# Patient Record
Sex: Male | Born: 1975 | Race: Black or African American | Hispanic: No | Marital: Single | Smoking: Current every day smoker
Health system: Southern US, Community
[De-identification: ages and names within clinical notes are randomized; demographics above are authoritative.]

## PROBLEM LIST (undated history)

## (undated) DIAGNOSIS — I10 Essential (primary) hypertension: Secondary | ICD-10-CM

## (undated) DIAGNOSIS — E119 Type 2 diabetes mellitus without complications: Secondary | ICD-10-CM

## (undated) HISTORY — PX: APPENDECTOMY: SHX54

---

## 2006-09-19 ENCOUNTER — Emergency Department: Payer: Self-pay

## 2007-03-04 ENCOUNTER — Emergency Department: Payer: Self-pay | Admitting: Emergency Medicine

## 2010-12-24 ENCOUNTER — Emergency Department: Payer: Self-pay | Admitting: Emergency Medicine

## 2011-01-12 ENCOUNTER — Ambulatory Visit: Payer: Self-pay | Admitting: Emergency Medicine

## 2011-01-17 ENCOUNTER — Ambulatory Visit: Payer: Self-pay | Admitting: Emergency Medicine

## 2011-01-18 LAB — PATHOLOGY REPORT

## 2011-09-18 ENCOUNTER — Emergency Department: Payer: Self-pay | Admitting: Emergency Medicine

## 2012-10-30 ENCOUNTER — Emergency Department: Payer: Self-pay | Admitting: Internal Medicine

## 2012-10-30 LAB — BASIC METABOLIC PANEL
Chloride: 106 mmol/L (ref 98–107)
Co2: 26 mmol/L (ref 21–32)
Creatinine: 0.92 mg/dL (ref 0.60–1.30)
EGFR (African American): >60
EGFR (Non-African Amer.): >60
Osmolality: 275 (ref 275–301)
Potassium: 4.1 mmol/L (ref 3.5–5.1)
Sodium: 138 mmol/L (ref 136–145)

## 2012-10-30 LAB — CBC WITH DIFFERENTIAL/PLATELET
Eosinophil #: 0.2 10*3/uL (ref 0.0–0.7)
Eosinophil %: 2 %
HGB: 13.6 g/dL (ref 13.0–18.0)
MCHC: 33.9 g/dL (ref 32.0–36.0)
Monocyte #: 0.6 x10 3/mm (ref 0.2–1.0)
Monocyte %: 6.9 %
Neutrophil %: 55.1 %
Platelet: 201 10*3/uL (ref 150–440)
RDW: 14.7 % — ABNORMAL HIGH (ref 11.5–14.5)
WBC: 8 10*3/uL (ref 3.8–10.6)

## 2014-02-06 ENCOUNTER — Emergency Department: Payer: Self-pay | Admitting: Emergency Medicine

## 2014-02-06 LAB — COMPREHENSIVE METABOLIC PANEL
ALT: 45 U/L
ANION GAP: 17 — AB (ref 7–16)
Albumin: 3.2 g/dL — ABNORMAL LOW (ref 3.4–5.0)
Alkaline Phosphatase: 47 U/L
BILIRUBIN TOTAL: 0.2 mg/dL (ref 0.2–1.0)
BUN: 13 mg/dL (ref 7–18)
CALCIUM: 7.6 mg/dL — AB (ref 8.5–10.1)
CO2: 17 mmol/L — AB (ref 21–32)
Chloride: 108 mmol/L — ABNORMAL HIGH (ref 98–107)
Creatinine: 1.25 mg/dL (ref 0.60–1.30)
EGFR (Non-African Amer.): >60
Glucose: 234 mg/dL — ABNORMAL HIGH (ref 65–99)
Osmolality: 291 (ref 275–301)
Potassium: 3.1 mmol/L — ABNORMAL LOW (ref 3.5–5.1)
SGOT(AST): 24 U/L (ref 15–37)
Sodium: 142 mmol/L (ref 136–145)
Total Protein: 5.8 g/dL — ABNORMAL LOW (ref 6.4–8.2)

## 2014-02-06 LAB — CBC
HCT: 42.7 % (ref 40.0–52.0)
HGB: 13.7 g/dL (ref 13.0–18.0)
MCH: 23 pg — AB (ref 26.0–34.0)
MCHC: 32.1 g/dL (ref 32.0–36.0)
MCV: 72 fL — AB (ref 80–100)
PLATELETS: 263 10*3/uL (ref 150–440)
RBC: 5.96 10*6/uL — ABNORMAL HIGH (ref 4.40–5.90)
RDW: 14.2 % (ref 11.5–14.5)
WBC: 12 10*3/uL — ABNORMAL HIGH (ref 3.8–10.6)

## 2014-02-06 LAB — DRUG SCREEN, URINE

## 2014-02-06 LAB — TROPONIN I: Troponin-I: <0.02 ng/mL

## 2014-02-06 LAB — ETHANOL
ETHANOL %: 0.069 % (ref 0.000–0.080)
Ethanol: 69 mg/dL

## 2015-05-12 ENCOUNTER — Encounter: Payer: Self-pay | Admitting: Emergency Medicine

## 2015-05-12 ENCOUNTER — Emergency Department
Admission: EM | Admit: 2015-05-12 | Discharge: 2015-05-12 | Disposition: A | Discharge status: Home / Self Care | Payer: Self-pay | Attending: Emergency Medicine | Admitting: Emergency Medicine

## 2015-05-12 DIAGNOSIS — F10220 Alcohol dependence with intoxication, uncomplicated: Secondary | ICD-10-CM | POA: Insufficient documentation

## 2015-05-12 DIAGNOSIS — F10920 Alcohol use, unspecified with intoxication, uncomplicated: Secondary | ICD-10-CM

## 2015-05-12 DIAGNOSIS — F17210 Nicotine dependence, cigarettes, uncomplicated: Secondary | ICD-10-CM | POA: Insufficient documentation

## 2015-05-12 HISTORY — DX: Type 2 diabetes mellitus without complications: E11.9

## 2015-05-12 NOTE — ED Notes (Signed)
Patient ambulatory to triage with steady gait, without difficulty or distress noted, [facility] [place] [facility]; officer reports breath analysis indicated .33; pt calm, cooperative with no c/o

## 2015-05-12 NOTE — Discharge Instructions (Signed)
Alcohol Intoxication °Alcohol intoxication occurs when the amount of alcohol that a person has consumed impairs his or her ability to mentally and physically function. Alcohol directly impairs the normal chemical activity of the brain. Drinking large amounts of alcohol can lead to changes in mental function and behavior, and it can cause many physical effects that can be harmful.  °Alcohol intoxication can range in severity from mild to very severe. Various factors can affect the level of intoxication that occurs, such as the person's age, gender, weight, frequency of alcohol consumption, and the presence of other medical conditions (such as diabetes, seizures, or heart conditions). Dangerous levels of alcohol intoxication may occur when people drink large amounts of alcohol in a short period (binge drinking). Alcohol can also be especially dangerous when combined with certain prescription medicines or "recreational" drugs. °SIGNS AND SYMPTOMS °Some common signs and symptoms of mild alcohol intoxication include: °· Loss of coordination. °· Changes in mood and behavior. °· Impaired judgment. °· Slurred speech. °As alcohol intoxication progresses to more severe levels, other signs and symptoms will appear. These may include: °· Vomiting. °· Confusion and impaired memory. °· Slowed breathing. °· Seizures. °· Loss of consciousness. °DIAGNOSIS  °Your health care provider will take a medical history and perform a physical exam. You will be asked about the amount and type of alcohol you have consumed. Blood tests will be done to measure the concentration of alcohol in your blood. In many places, your blood alcohol level must be lower than 80 mg/dL (0.08%) to legally drive. However, many dangerous effects of alcohol can occur at much lower levels.  °TREATMENT  °People with alcohol intoxication often do not require treatment. Most of the effects of alcohol intoxication are temporary, and they go away as the alcohol naturally  leaves the body. Your health care provider will monitor your condition until you are stable enough to go home. Fluids are sometimes given through an IV access tube to help prevent dehydration.  °HOME CARE INSTRUCTIONS °· Do not drive after drinking alcohol. °· Stay hydrated. Drink enough water and fluids to keep your urine clear or pale yellow. Avoid caffeine.   °· Only take over-the-counter or prescription medicines as directed by your health care provider.   °SEEK MEDICAL CARE IF:  °· You have persistent vomiting.   °· You do not feel better after a few days. °· You have frequent alcohol intoxication. Your health care provider can help determine if you should see a substance use treatment counselor. °SEEK IMMEDIATE MEDICAL CARE IF:  °· You become shaky or tremble when you try to stop drinking.   °· You shake uncontrollably (seizure).   °· You throw up (vomit) blood. This may be bright red or may look like black coffee grounds.   °· You have blood in your stool. This may be bright red or may appear as a black, tarry, bad smelling stool.   °· You become lightheaded or faint.   °MAKE SURE YOU:  °· Understand these instructions. °· Will watch your condition. °· Will get help right away if you are not doing well or get worse. °  °This information is not intended to replace advice given to you by your health care provider. Make sure you discuss any questions you have with your health care provider. °  °Document Released: 03/15/2005 Document Revised: 02/05/2013 Document Reviewed: 11/08/2012 °Elsevier Interactive Patient Education ©2016 [employer] ° °Please return immediately if condition worsens. Please contact her primary physician or the physician you were given for referral. If you   have any specialist physicians involved in her treatment and plan please also contact them. Thank you for using [facility]. ° °

## 2015-05-12 NOTE — ED Provider Notes (Signed)
Time Seen: Approximately 2200 I have reviewed the triage notes  Chief Complaint: Alcohol Intoxication   History of Present Illness: Gregory Gibson is a 39 y.o. male who is here for medical clearance for incarceration. Patient apparently drank a large amount of alcohol and had a breathalyzer test at 0.33. Patient otherwise denies any illicit drugs. He's been up and ambulatory here in emergency department without difficulty and is currently oriented 3. Skin has no physical complaints. Past Medical History  Diagnosis Date  . Diabetes mellitus without complication (HCC)     There are no active problems to display for this patient.   History reviewed. No pertinent past surgical history.  History reviewed. No pertinent past surgical history.  No current outpatient prescriptions on file.  Allergies:  Review of patient's allergies indicates no known allergies.  Family History: No family history on file.  Social History: Social History  Substance Use Topics  . Smoking status: Current Every Day Smoker    Types: Cigarettes  . Smokeless tobacco: None  . Alcohol Use: Yes     Review of Systems:   10 point review of systems was performed and was otherwise negative:  Constitutional: No fever Eyes: No visual disturbances ENT: No sore throat, ear pain Cardiac: No chest pain Respiratory: No shortness of breath, wheezing, or stridor Abdomen: No abdominal pain, no vomiting, No diarrhea Endocrine: No weight loss, No night sweats Extremities: No peripheral edema, cyanosis Skin: No rashes, easy bruising Neurologic: No focal weakness, trouble with speech or swollowing Urologic: No dysuria, Hematuria, or urinary frequency Patient denies any trauma or joint pain.  Physical Exam:  ED Triage Vitals  Enc Vitals Group     BP 05/12/15 2157 129/66 mmHg     Pulse Rate 05/12/15 2157 90     Resp 05/12/15 2157 20     Temp 05/12/15 2157 98.6 F (37 C)     Temp Source 05/12/15 2157 Oral      SpO2 05/12/15 2157 97 %     Weight 05/12/15 2157 194 lb (87.998 kg)     Height 05/12/15 2157 6' (1.829 m)     Head Cir --      Peak Flow --      Pain Score --      Pain Loc --      Pain Edu? --      Excl. in GC? --     General: Awake , Alert , and Oriented times 3; GCS 15 Head: Normal cephalic , atraumatic Eyes: Pupils equal , round, reactive to light Nose/Throat: No nasal drainage, patent upper airway without erythema or exudate.  Neck: Supple, Full range of motion, No anterior adenopathy or palpable thyroid masses Lungs: Clear to ascultation without wheezes , rhonchi, or rales Heart: Regular rate, regular rhythm without murmurs , gallops , or rubs Abdomen: Soft, non tender without rebound, guarding , or rigidity; bowel sounds positive and symmetric in all 4 quadrants. No organomegaly .        Extremities: 2 plus symmetric pulses. No edema, clubbing or cyanosis Neurologic: normal ambulation, Motor symmetric without deficits, sensory intact Skin: warm, dry, no rashes    ED Course: Patient's able to answer questions is awake alert and oriented. He denies alcohol level was elevated and he seems to be competent and clear from a medical standpoint.    Final Clinical Impression:   Final diagnoses:  Alcohol intoxication, uncomplicated (HCC)     Plan:  Patient was discharged ambulatory in the  custody of [place] police department           Jennye Moccasin, MD 05/12/15 [phone]

## 2015-05-12 NOTE — ED Notes (Signed)
Pt in custody of police, sent from jail, pt BAC at jail was 0.33, sent here for MD eval clearance return to jail.

## 2015-09-26 ENCOUNTER — Encounter: Payer: Self-pay | Admitting: *Deleted

## 2015-09-26 ENCOUNTER — Emergency Department
Admission: EM | Admit: 2015-09-26 | Discharge: 2015-09-26 | Disposition: A | Discharge status: Home / Self Care | Payer: Self-pay | Attending: Emergency Medicine | Admitting: Emergency Medicine

## 2015-09-26 DIAGNOSIS — I10 Essential (primary) hypertension: Secondary | ICD-10-CM | POA: Insufficient documentation

## 2015-09-26 DIAGNOSIS — F17210 Nicotine dependence, cigarettes, uncomplicated: Secondary | ICD-10-CM | POA: Insufficient documentation

## 2015-09-26 DIAGNOSIS — E1165 Type 2 diabetes mellitus with hyperglycemia: Secondary | ICD-10-CM | POA: Insufficient documentation

## 2015-09-26 DIAGNOSIS — E118 Type 2 diabetes mellitus with unspecified complications: Secondary | ICD-10-CM

## 2015-09-26 HISTORY — DX: Essential (primary) hypertension: I10

## 2015-09-26 LAB — CBC
HEMATOCRIT: 42.1 % (ref 40.0–52.0)
HEMOGLOBIN: 13.6 g/dL (ref 13.0–18.0)
MCH: 21.8 pg — ABNORMAL LOW (ref 26.0–34.0)
MCHC: 32.4 g/dL (ref 32.0–36.0)
MCV: 67.4 fL — AB (ref 80.0–100.0)
Platelets: 192 10*3/uL (ref 150–440)
RBC: 6.25 MIL/uL — AB (ref 4.40–5.90)
RDW: 14 % (ref 11.5–14.5)
WBC: 8.5 10*3/uL (ref 3.8–10.6)

## 2015-09-26 LAB — URINALYSIS COMPLETE WITH MICROSCOPIC (ARMC ONLY)
BACTERIA UA: NONE SEEN
BILIRUBIN URINE: NEGATIVE
Glucose, UA: >500 mg/dL — AB
HGB URINE DIPSTICK: NEGATIVE
Ketones, ur: NEGATIVE mg/dL
LEUKOCYTES UA: NEGATIVE
NITRITE: NEGATIVE
PH: 5 (ref 5.0–8.0)
Protein, ur: NEGATIVE mg/dL
SPECIFIC GRAVITY, URINE: 1.035 — AB (ref 1.005–1.030)
SQUAMOUS EPITHELIAL / LPF: NONE SEEN

## 2015-09-26 LAB — BASIC METABOLIC PANEL
ANION GAP: 7 (ref 5–15)
BUN: 19 mg/dL (ref 6–20)
CO2: 25 mmol/L (ref 22–32)
Calcium: 9.6 mg/dL (ref 8.9–10.3)
Chloride: 99 mmol/L — ABNORMAL LOW (ref 101–111)
Creatinine, Ser: 0.8 mg/dL (ref 0.61–1.24)
Glucose, Bld: 390 mg/dL — ABNORMAL HIGH (ref 65–99)
Potassium: 4.3 mmol/L (ref 3.5–5.1)
Sodium: 131 mmol/L — ABNORMAL LOW (ref 135–145)

## 2015-09-26 LAB — GLUCOSE, CAPILLARY
GLUCOSE-CAPILLARY: 401 mg/dL — AB (ref 65–99)
Glucose-Capillary: 437 mg/dL — ABNORMAL HIGH (ref 65–99)
Glucose-Capillary: 318 mg/dL — ABNORMAL HIGH (ref 65–99)

## 2015-09-26 MED ORDER — SODIUM CHLORIDE 0.9 % IV BOLUS (SEPSIS)
1000.0000 mL | Freq: Once | INTRAVENOUS | Status: AC
  Administered 2015-09-26: 1000 mL via INTRAVENOUS

## 2015-09-26 MED ORDER — GLIPIZIDE 10 MG PO TABS: 20.0000 mg | ORAL_TABLET | Freq: Two times a day (BID) | ORAL | Status: DC

## 2015-09-26 MED ORDER — METFORMIN HCL 1000 MG PO TABS: 1000.0000 mg | ORAL_TABLET | Freq: Two times a day (BID) | ORAL | Status: DC

## 2015-09-26 NOTE — ED Provider Notes (Addendum)
[facility] Emergency Department Provider Note     Time seen: ----------------------------------------- 2:21 PM on 09/26/2015 -----------------------------------------    I have reviewed the triage vital signs and the nursing notes.   HISTORY  Chief Complaint Hyperglycemia    HPI Gregory Gibson is a 40 y.o. male who presents ER for hypoglycemia. Patient states she's been out of jail for 2 weeks and his blood sugar has been poorly regulated since that period time. Blood sugar remains in the 2-300s, he denies any other complaints.He denies fevers, chills, chest pain, shortness of breath or other complaints. Patient states his been compliant with his medications and diet.   Past Medical History  Diagnosis Date  . Diabetes mellitus without complication (HCC)   . Hypertension     There are no active problems to display for this patient.   History reviewed. No pertinent past surgical history.  Allergies Review of patient's allergies indicates no known allergies.  Social History Social History  Substance Use Topics  . Smoking status: Current Every Day Smoker    Types: Cigarettes  . Smokeless tobacco: None  . Alcohol Use: Yes   Review of Systems Constitutional: Negative for fever. Eyes: Negative for visual changes. ENT: Negative for sore throat. Cardiovascular: Negative for chest pain. Respiratory: Negative for shortness of breath. Gastrointestinal: Negative for abdominal pain, vomiting and diarrhea. Genitourinary: Negative for dysuria. Musculoskeletal: Negative for back pain. Skin: Negative for rash. Neurological: Negative for headaches, focal weakness or numbness.  10-point ROS otherwise negative.  ____________________________________________   PHYSICAL EXAM:  VITAL SIGNS: ED Triage Vitals  Enc Vitals Group     BP 09/26/15 1124 120/78 mmHg     Pulse Rate 09/26/15 1124 98     Resp 09/26/15 1124 18     Temp 09/26/15 1124 97.8 F  (36.6 C)     Temp Source 09/26/15 1124 Oral     SpO2 09/26/15 1124 99 %     Weight 09/26/15 1124 188 lb (85.276 kg)     Height 09/26/15 1124  (1.803 m)     Head Cir --      Peak Flow --      Pain Score --      Pain Loc --      Pain Edu? --      Excl. in GC? --    Constitutional: Alert and oriented. Well appearing and in no distress. Eyes: Conjunctivae are normal. PERRL. Normal extraocular movements. ENT   Head: Normocephalic and atraumatic.   Nose: No congestion/rhinnorhea.   Mouth/Throat: Mucous membranes are moist.   Neck: No stridor. Cardiovascular: Normal rate, regular rhythm. No murmurs, rubs, or gallops. Respiratory: Normal respiratory effort without tachypnea nor retractions. Breath sounds are clear and equal bilaterally. No wheezes/rales/rhonchi. Gastrointestinal: Soft and nontender. Normal bowel sounds Musculoskeletal: Nontender with normal range of motion in all extremities. No joint effusions.  No lower extremity tenderness nor edema. Neurologic:  Normal speech and language. No gross focal neurologic deficits are appreciated.  Skin:  Skin is warm, dry and intact. No rash noted. Psychiatric: Mood and affect are normal. Speech and behavior are normal. Patient exhibits appropriate insight and judgment. ____________________________________________  ED COURSE:  Pertinent labs & imaging results that were available during my care of the patient were reviewed by me and considered in my medical decision making (see chart for details).  she is in no acute distress, will check basic labs and reevaluate. ____________________________________________    LABS (pertinent positives/negatives)  Labs Reviewed  BASIC METABOLIC PANEL - Abnormal; Notable for the following:    Sodium 131 (*)    Chloride 99 (*)    Glucose, Bld 390 (*)    All other components within normal limits  CBC - Abnormal; Notable for the following:    RBC 6.25 (*)    MCV 67.4 (*)    MCH 21.8  (*)    All other components within normal limits  URINALYSIS COMPLETEWITH MICROSCOPIC ([facility] ONLY) - Abnormal; Notable for the following:    Color, Urine YELLOW (*)    APPearance CLEAR (*)    Glucose, UA >500 (*)    Specific Gravity, Urine 1.035 (*)    All other components within normal limits  GLUCOSE, CAPILLARY - Abnormal; Notable for the following:    Glucose-Capillary 401 (*)    All other components within normal limits  GLUCOSE, CAPILLARY - Abnormal; Notable for the following:    Glucose-Capillary 437 (*)    All other components within normal limits  GLUCOSE, CAPILLARY - Abnormal; Notable for the following:    Glucose-Capillary 318 (*)    All other components within normal limits  CBG MONITORING, ED  ____________________________________________  FINAL ASSESSMENT AND PLAN    type 2 diabetes in     Plan: Patient with labs as dictated above.  patient is in no acute distress, his labs are grossly unremarkable with the exception of hyperglycemia. Current blood sugar is 318. I will increase his glipizide and advise close follow-up with his doctor for reevaluation.   Emily FilbertWilliams, Jonathan E, MD   Emily FilbertJonathan E Williams, MD 09/26/15 1423  Emily FilbertJonathan E Williams, MD 09/26/15 [phone]

## 2015-09-26 NOTE — ED Notes (Signed)
1 st liter of NS complete blood glucose 437, Derrill KayGoodman, MD informed. Orders received for 2nd bag of NS. NAD pt states he has not eaten or drank since being here

## 2015-09-26 NOTE — ED Notes (Signed)
Pt states hyperglycemia, cbg of 254 and 354, states he is on metformin and glipizide, states he just got out of jail where he did not have his glipizide and has just been on it for 2 weeks

## 2015-09-26 NOTE — Discharge Instructions (Signed)
Blood Glucose Monitoring, Adult Monitoring your blood glucose (also know as blood sugar) helps you to manage your diabetes. It also helps you and your health care provider monitor your diabetes and determine how well your treatment plan is working. WHY SHOULD YOU MONITOR YOUR BLOOD GLUCOSE?  It can help you understand how food, exercise, and medicine affect your blood glucose.  It allows you to know what your blood glucose is at any given moment. You can quickly tell if you are having low blood glucose (hypoglycemia) or high blood glucose (hyperglycemia).  It can help you and your health care provider know how to adjust your medicines.  It can help you understand how to manage an illness or adjust medicine for exercise. WHEN SHOULD YOU TEST? Your health care provider will help you decide how often you should check your blood glucose. This may depend on the type of diabetes you have, your diabetes control, or the types of medicines you are taking. Be sure to write down all of your blood glucose readings so that this information can be reviewed with your health care provider. See below for examples of testing times that your health care provider may suggest. Type 1 Diabetes  Test at least 2 times per day if your diabetes is well controlled, if you are using an insulin pump, or if you perform multiple daily injections.  If your diabetes is not well controlled or if you are sick, you may need to test more often.  It is a good idea to also test:  Before every insulin injection.  Before and after exercise.  Between meals and 2 hours after a meal.  Occasionally between 2:00 a.m. and 3:00 a.m. Type 2 Diabetes  If you are taking insulin, test at least 2 times per day. However, it is best to test before every insulin injection.  If you take medicines by mouth (orally), test 2 times a day.  If you are on a controlled diet, test once a day.  If your diabetes is not well controlled or if you  are sick, you may need to monitor more often. HOW TO MONITOR YOUR BLOOD GLUCOSE Supplies Needed  Blood glucose meter.  Test strips for your meter. Each meter has its own strips. You must use the strips that go with your own meter.  A pricking needle (lancet).  A device that holds the lancet (lancing device).  A journal or log book to write down your results. Procedure  Wash your hands with soap and water. Alcohol is not preferred.  Prick the side of your finger (not the tip) with the lancet.  Gently milk the finger until a small drop of blood appears.  Follow the instructions that come with your meter for inserting the test strip, applying blood to the strip, and using your blood glucose meter. Other Areas to Get Blood for Testing Some meters allow you to use other areas of your body (other than your finger) to test your blood. These areas are called alternative sites. The most common alternative sites are:  The forearm.  The thigh.  The back area of the lower leg.  The palm of the hand. The blood flow in these areas is slower. Therefore, the blood glucose values you get may be delayed, and the numbers are different from what you would get from your fingers. Do not use alternative sites if you think you are having hypoglycemia. Your reading will not be accurate. Always use a finger if you are  having hypoglycemia. Also, if you cannot feel your lows (hypoglycemia unawareness), always use your fingers for your blood glucose checks. ADDITIONAL TIPS FOR GLUCOSE MONITORING  Do not reuse lancets.  Always carry your supplies with you.  All blood glucose meters have a 24-hour "hotline" number to call if you have questions or need help.  Adjust (calibrate) your blood glucose meter with a control solution after finishing a few boxes of strips. BLOOD GLUCOSE RECORD KEEPING It is a good idea to keep a daily record or log of your blood glucose readings. Most glucose meters, if not all,  keep your glucose records stored in the meter. Some meters come with the ability to download your records to your home computer. Keeping a record of your blood glucose readings is especially helpful if you are wanting to look for patterns. Make notes to go along with the blood glucose readings because you might forget what happened at that exact time. Keeping good records helps you and your health care provider to work together to achieve good diabetes management.    This information is not intended to replace advice given to you by your health care provider. Make sure you discuss any questions you have with your health care provider.   Document Released: 06/08/2003 Document Revised: 06/26/2014 Document Reviewed: 10/28/2012 Elsevier Interactive Patient Education 2016 Elsevier Inc.  Type 2 Diabetes Mellitus, Adult Type 2 diabetes mellitus, often simply referred to as type 2 diabetes, is a long-lasting (chronic) disease. In type 2 diabetes, the pancreas does not make enough insulin (a hormone), the cells are less responsive to the insulin that is made (insulin resistance), or both. Normally, insulin moves sugars from food into the tissue cells. The tissue cells use the sugars for energy. The lack of insulin or the lack of normal response to insulin causes excess sugars to build up in the blood instead of going into the tissue cells. As a result, high blood sugar (hyperglycemia) develops. The effect of high sugar (glucose) levels can cause many complications. Type 2 diabetes was also previously called adult-onset diabetes, but it can occur at any age.  RISK FACTORS  A person is predisposed to developing type 2 diabetes if someone in the family has the disease and also has one or more of the following primary risk factors:  Weight gain, or being overweight or obese.  An inactive lifestyle.  A history of consistently eating high-calorie foods. Maintaining a normal weight and regular physical  activity can reduce the chance of developing type 2 diabetes. SYMPTOMS  A person with type 2 diabetes may not show symptoms initially. The symptoms of type 2 diabetes appear slowly. The symptoms include:  Increased thirst (polydipsia).  Increased urination (polyuria).  Increased urination during the night (nocturia).  Sudden or unexplained weight changes.  Frequent, recurring infections.  Tiredness (fatigue).  Weakness.  Vision changes, such as blurred vision.  Fruity smell to your breath.  Abdominal pain.  Nausea or vomiting.  Cuts or bruises which are slow to heal.  Tingling or numbness in the hands or feet.  An open skin wound (ulcer). DIAGNOSIS Type 2 diabetes is frequently not diagnosed until complications of diabetes are present. Type 2 diabetes is diagnosed when symptoms or complications are present and when blood glucose levels are increased. Your blood glucose level may be checked by one or more of the following blood tests:  A fasting blood glucose test. You will not be allowed to eat for at least 8 hours before a  blood sample is taken.  A random blood glucose test. Your blood glucose is checked at any time of the day regardless of when you ate.  A hemoglobin A1c blood glucose test. A hemoglobin A1c test provides information about blood glucose control over the previous 3 months.  An oral glucose tolerance test (OGTT). Your blood glucose is measured after you have not eaten (fasted) for 2 hours and then after you drink a glucose-containing beverage. TREATMENT   You may need to take insulin or diabetes medicine daily to keep blood glucose levels in the desired range.  If you use insulin, you may need to adjust the dosage depending on the carbohydrates that you eat with each meal or snack.  Lifestyle changes are recommended as part of your treatment. These may include:  Following an individualized diet plan developed by a nutritionist or  dietitian.  Exercising daily. Your health care providers will set individualized treatment goals for you based on your age, your medicines, how long you have had diabetes, and any other medical conditions you have. Generally, the goal of treatment is to maintain the following blood glucose levels:  Before meals (preprandial): 80-130 mg/dL.  After meals (postprandial): below 180 mg/dL.  A1c: less than 6.5-7%. HOME CARE INSTRUCTIONS   Have your hemoglobin A1c level checked twice a year.  Perform daily blood glucose monitoring as directed by your health care provider.  Monitor urine ketones when you are ill and as directed by your health care provider.  Take your diabetes medicine or insulin as directed by your health care provider to maintain your blood glucose levels in the desired range.  Never run out of diabetes medicine or insulin. It is needed every day.  If you are using insulin, you may need to adjust the amount of insulin given based on your intake of carbohydrates. Carbohydrates can raise blood glucose levels but need to be included in your diet. Carbohydrates provide vitamins, minerals, and fiber which are an essential part of a healthy diet. Carbohydrates are found in fruits, vegetables, whole grains, dairy products, legumes, and foods containing added sugars.  Eat healthy foods. You should make an appointment to see a registered dietitian to help you create an eating plan that is right for you.  Lose weight if you are overweight.  Carry a medical alert card or wear your medical alert jewelry.  Carry a 15-gram carbohydrate snack with you at all times to treat low blood glucose (hypoglycemia). Some examples of 15-gram carbohydrate snacks include:  Glucose tablets, 3 or 4.  Glucose gel, 15-gram tube.  Raisins, 2 tablespoons (24 grams).  Jelly beans, 6.  Animal crackers, 8.  Regular pop, 4 ounces (120 mL).  Gummy treats, 9.  Recognize hypoglycemia. Hypoglycemia  occurs with blood glucose levels of 70 mg/dL and below. The risk for hypoglycemia increases when fasting or skipping meals, during or after intense exercise, and during sleep. Hypoglycemia symptoms can include:  Tremors or shakes.  Decreased ability to concentrate.  Sweating.  Increased heart rate.  Headache.  Dry mouth.  Hunger.  Irritability.  Anxiety.  Restless sleep.  Altered speech or coordination.  Confusion.  Treat hypoglycemia promptly. If you are alert and able to safely swallow, follow the 15:15 rule:  Take 15-20 grams of rapid-acting glucose or carbohydrate. Rapid-acting options include glucose gel, glucose tablets, or 4 ounces (120 mL) of fruit juice, regular soda, or low-fat milk.  Check your blood glucose level 15 minutes after taking the glucose.  Take 15-20  grams more of glucose if the repeat blood glucose level is still 70 mg/dL or below.  Eat a meal or snack within 1 hour once blood glucose levels return to normal.  Be alert to feeling very thirsty and urinating more frequently than usual, which are early signs of hyperglycemia. An early awareness of hyperglycemia allows for prompt treatment. Treat hyperglycemia as directed by your health care provider.  Engage in at least 150 minutes of moderate-intensity physical activity a week, spread over at least 3 days of the week or as directed by your health care provider. In addition, you should engage in resistance exercise at least 2 times a week or as directed by your health care provider. Try to spend no more than 90 minutes at one time inactive.  Adjust your medicine and food intake as needed if you start a new exercise or sport.  Follow your sick-day plan anytime you are unable to eat or drink as usual.  Do not use any tobacco products including cigarettes, chewing tobacco, or electronic cigarettes. If you need help quitting, ask your health care provider.  Limit alcohol intake to no more than 1 drink  per day for nonpregnant women and 2 drinks per day for men. You should drink alcohol only when you are also eating food. Talk with your health care provider whether alcohol is safe for you. Tell your health care provider if you drink alcohol several times a week.  Keep all follow-up visits as directed by your health care provider. This is important.  Schedule an eye exam soon after the diagnosis of type 2 diabetes and then annually.  Perform daily skin and foot care. Examine your skin and feet daily for cuts, bruises, redness, nail problems, bleeding, blisters, or sores. A foot exam by a health care provider should be done annually.  Brush your teeth and gums at least twice a day and floss at least once a day. Follow up with your dentist regularly.  Share your diabetes management plan with your workplace or school.  Keep your immunizations up to date. It is recommended that you receive a flu (influenza) vaccine every year. It is also recommended that you receive a pneumonia (pneumococcal) vaccine. If you are 37 years of age or older and have never received a pneumonia vaccine, this vaccine may be given as a series of two separate shots. Ask your health care provider which additional vaccines may be recommended.  Learn to manage stress.  Obtain ongoing diabetes education and support as needed.  Participate in or seek rehabilitation as needed to maintain or improve independence and quality of life. Request a physical or occupational therapy referral if you are having foot or hand numbness, or difficulties with grooming, dressing, eating, or physical activity. SEEK MEDICAL CARE IF:   You are unable to eat food or drink fluids for more than 6 hours.  You have nausea and vomiting for more than 6 hours.  Your blood glucose level is over 240 mg/dL.  There is a change in mental status.  You develop an additional serious illness.  You have diarrhea for more than 6 hours.  You have been sick  or have had a fever for a couple of days and are not getting better.  You have pain during any physical activity.  SEEK IMMEDIATE MEDICAL CARE IF:  You have difficulty breathing.  You have moderate to large ketone levels.   This information is not intended to replace advice given to you by your  health care provider. Make sure you discuss any questions you have with your health care provider.   Document Released: 06/05/2005 Document Revised: 02/24/2015 Document Reviewed: 01/02/2012 Elsevier Interactive Patient Education [employer]2016 Elsevier Inc.

## 2015-11-29 ENCOUNTER — Encounter: Payer: Self-pay | Admitting: Emergency Medicine

## 2015-11-29 ENCOUNTER — Emergency Department
Admission: EM | Admit: 2015-11-29 | Discharge: 2015-11-29 | Disposition: A | Discharge status: Home / Self Care | Payer: Self-pay | Attending: Emergency Medicine | Admitting: Emergency Medicine

## 2015-11-29 DIAGNOSIS — F17210 Nicotine dependence, cigarettes, uncomplicated: Secondary | ICD-10-CM | POA: Insufficient documentation

## 2015-11-29 DIAGNOSIS — Z7984 Long term (current) use of oral hypoglycemic drugs: Secondary | ICD-10-CM | POA: Insufficient documentation

## 2015-11-29 DIAGNOSIS — I10 Essential (primary) hypertension: Secondary | ICD-10-CM | POA: Insufficient documentation

## 2015-11-29 DIAGNOSIS — E119 Type 2 diabetes mellitus without complications: Secondary | ICD-10-CM | POA: Insufficient documentation

## 2015-11-29 DIAGNOSIS — K649 Unspecified hemorrhoids: Secondary | ICD-10-CM | POA: Insufficient documentation

## 2015-11-29 DIAGNOSIS — K6289 Other specified diseases of anus and rectum: Secondary | ICD-10-CM

## 2015-11-29 MED ORDER — HYDROCODONE-ACETAMINOPHEN 5-325 MG PO TABS: 1.0000 | ORAL_TABLET | Freq: Four times a day (QID) | ORAL | Status: DC | PRN

## 2015-11-29 MED ORDER — HYDROCORTISONE ACETATE 25 MG RE SUPP
25.0000 mg | Freq: Two times a day (BID) | RECTAL | Status: AC
Start: 2015-11-29 — End: 2015-12-29

## 2015-11-29 MED ORDER — DIBUCAINE 1 % RE OINT: 1.0000 "application " | TOPICAL_OINTMENT | Freq: Three times a day (TID) | RECTAL | Status: DC | PRN

## 2015-11-29 NOTE — ED Notes (Signed)
Patient presents to the ED for rectal pain x 2 weeks with inflammation and pain around rectum.  Patient states he has been using a cream which has been helping his pain.  Patient is in no obvious distress.  Patient reports small amount of rectal bleeding when he wipes after a BM.

## 2015-11-29 NOTE — ED Provider Notes (Signed)
[facility] Emergency Department Provider Note ____________________________________________  Time seen: 2019  I have reviewed the triage vital signs and the nursing notes.  HISTORY  Chief Complaint  Rectal Pain  HPI Gregory Gibson is a 40 y.o. male presents to the ED for evaluation of a 2 week complaint of rectal pain, fullness, and intermittent bright red blood on the toilet tissue. Patient denies any fevers, chills, sweats. He also denies any significant firm stools constipation. He does note normal daily stools that are soft in nature due to his metformin.  Past Medical History  Diagnosis Date  . Diabetes mellitus without complication (HCC)   . Hypertension    There are no active problems to display for this patient.  History reviewed. No pertinent past surgical history.  Current Outpatient Rx  Name  Route  Sig  Dispense  Refill  . dibucaine (NUPERCAINAL) 1 % OINT   Rectal   Place 1 application rectally 3 (three) times daily as needed for pain.   30 g   0   . glipiZIDE (GLUCOTROL) 10 MG tablet   Oral   Take 2 tablets (20 mg total) by mouth 2 (two) times daily before a meal.   120 tablet   1   . HYDROcodone-acetaminophen (NORCO) 5-325 MG tablet   Oral   Take 1 tablet by mouth every 6 (six) hours as needed for moderate pain.   12 tablet   0   . hydrocortisone (ANUSOL-HC) 25 MG suppository   Rectal   Place 1 suppository (25 mg total) rectally every 12 (twelve) hours.   12 suppository   1   . metFORMIN (GLUCOPHAGE) 1000 MG tablet   Oral   Take 1 tablet (1,000 mg total) by mouth 2 (two) times daily with a meal.   60 tablet   11    Allergies Review of patient's allergies indicates no known allergies.  No family history on file.  Social History Social History  Substance Use Topics  . Smoking status: Current Every Day Smoker    Types: Cigarettes  . Smokeless tobacco: None  . Alcohol Use: Yes   Review of Systems  Constitutional:  Negative for fever. Gastrointestinal: Negative for abdominal pain, vomiting and diarrhea. Rectal pain as above.  Genitourinary: Negative for dysuria. Musculoskeletal: Negative for back pain. Skin: Negative for rash. Neurological: Negative for headaches, focal weakness or numbness. ____________________________________________  PHYSICAL EXAM:  VITAL SIGNS: ED Triage Vitals  Enc Vitals Group     BP 11/29/15 1907 141/98 mmHg     Pulse Rate 11/29/15 1907 100     Resp 11/29/15 1907 18     Temp 11/29/15 1907 98.4 F (36.9 C)     Temp Source 11/29/15 1907 Oral     SpO2 11/29/15 1907 98 %     Weight 11/29/15 1907 190 lb (86.183 kg)     Height 11/29/15 1907 5\' 11"  (1.803 m)     Head Cir --      Peak Flow --      Pain Score 11/29/15 1907 9     Pain Loc --      Pain Edu? --      Excl. in GC? --    Constitutional: Alert and oriented. Well appearing and in no distress. Head: Normocephalic and atraumatic. Respiratory: Normal respiratory effort. Gastrointestinal: Soft and nontender. No distention. Rectal exam shows a small, non-engorged external hemorrhoid. DRE limited by local pain. Musculoskeletal: Nontender with normal range of motion in all extremities.  Neurologic:  Normal gait without ataxia. Normal speech and language. No gross focal neurologic deficits are appreciated. Skin:  Skin is warm, dry and intact. No rash noted. ____________________________________________  INITIAL IMPRESSION / ASSESSMENT AND PLAN / ED COURSE  Patient with acute rectal pain likely due to hemorrhoids. He will be discharged with a prescription for hydrocortisone suppositories, dibucaine topical ointment, and Norco for acute pain relief. He will follow-up with his provider or [employer] as needed.  ____________________________________________  FINAL CLINICAL IMPRESSION(S) / ED DIAGNOSES  Final diagnoses:  Rectal pain  Hemorrhoids, unspecified hemorrhoid type     Lissa Hoard, PA-C 11/29/15 2050  Sharyn Creamer, MD 11/30/15 0008

## 2015-11-29 NOTE — Discharge Instructions (Signed)
Disposable Sitz Bath A disposable sitz bath is a plastic basin that fits over the toilet. A bag is hung above the toilet and is connected to a tube that opens into the disposable sitz bath. The bag is filled with warm water that can flow into the basin through the tube.  HOW TO USE A DISPOSABLE SITZ BATH  Close the clamp on the tubing before filling the bag with water. This is to prevent leakage.  Fill the sitz bath basin and the plastic bag with warm water.  Place the filled basin on the toilet with the seat raised. Make sure the overflow opening is facing toward the back of the toilet.  Hang the filled plastic bag overhead on a hook or towel rack close to the toilet. When the bag is unclamped, a steady stream of water will flow from the bag, through the tubing, and into the basin.  Attach the tubing to the opening on the basin.  Sit on the basin positioned on the toilet seat and release the clamp. This will allow warm water to flush the area around your genitals and anus (perineum).  Remain sitting on the basin for approximately 15 to 20 minutes.  Stand up and pat the perineum area dry. If needed, apply clean bandages (dressings) to the affected area.  Tip the basin into the toilet to remove any remaining water and flush the toilet.  Wash the basin with warm water and soap. Let it dry in the sink.  Store the basin and tubing in a clean, dry area.  Wash your hands with soap and water. SEEK MEDICAL CARE IF: You get worse instead of better. Stop the sitz baths if you get worse. MAKE SURE YOU:  Understand these instructions.  Will watch your condition.  Will get help right away if you are not doing well or get worse.   This information is not intended to replace advice given to you by your health care provider. Make sure you discuss any questions you have with your health care provider.   Document Released: 12/05/2011 Document Revised: 02/28/2012 Document Reviewed:  12/05/2011 Elsevier Interactive Patient Education 2016 [employer].  Hemorrhoids Hemorrhoids are swollen veins around the rectum or anus. There are two types of hemorrhoids:   Internal hemorrhoids. These occur in the veins just inside the rectum. They may poke through to the outside and become irritated and painful.  External hemorrhoids. These occur in the veins outside the anus and can be felt as a painful swelling or hard lump near the anus. CAUSES  Pregnancy.   Obesity.   Constipation or diarrhea.   Straining to have a bowel movement.   Sitting for long periods on the toilet.  Heavy lifting or other activity that caused you to strain.  Anal intercourse. SYMPTOMS   Pain.   Anal itching or irritation.   Rectal bleeding.   Fecal leakage.   Anal swelling.   One or more lumps around the anus.  DIAGNOSIS  Your caregiver may be able to diagnose hemorrhoids by visual examination. Other examinations or tests that may be performed include:   Examination of the rectal area with a gloved hand (digital rectal exam).   Examination of anal canal using a small tube (scope).   A blood test if you have lost a significant amount of blood.  A test to look inside the colon (sigmoidoscopy or colonoscopy). TREATMENT Most hemorrhoids can be treated at home. However, if symptoms do not seem to be getting  better or if you have a lot of rectal bleeding, your caregiver may perform a procedure to help make the hemorrhoids get smaller or remove them completely. Possible treatments include:   Placing a rubber band at the base of the hemorrhoid to cut off the circulation (rubber band ligation).   Injecting a chemical to shrink the hemorrhoid (sclerotherapy).   Using a tool to burn the hemorrhoid (infrared light therapy).   Surgically removing the hemorrhoid (hemorrhoidectomy).   Stapling the hemorrhoid to block blood flow to the tissue (hemorrhoid stapling).  HOME  CARE INSTRUCTIONS   Eat foods with fiber, such as whole grains, beans, nuts, fruits, and vegetables. Ask your doctor about taking products with added fiber in them (fibersupplements).  Increase fluid intake. Drink enough water and fluids to keep your urine clear or pale yellow.   Exercise regularly.   Go to the bathroom when you have the urge to have a bowel movement. Do not wait.   Avoid straining to have bowel movements.   Keep the anal area dry and clean. Use wet toilet paper or moist towelettes after a bowel movement.   Medicated creams and suppositories may be used or applied as directed.   Only take over-the-counter or prescription medicines as directed by your caregiver.   Take warm sitz baths for 15-20 minutes, 3-4 times a day to ease pain and discomfort.   Place ice packs on the hemorrhoids if they are tender and swollen. Using ice packs between sitz baths may be helpful.   Put ice in a plastic bag.   Place a towel between your skin and the bag.   Leave the ice on for 15-20 minutes, 3-4 times a day.   Do not use a donut-shaped pillow or sit on the toilet for long periods. This increases blood pooling and pain.  SEEK MEDICAL CARE IF:  You have increasing pain and swelling that is not controlled by treatment or medicine.  You have uncontrolled bleeding.  You have difficulty or you are unable to have a bowel movement.  You have pain or inflammation outside the area of the hemorrhoids. MAKE SURE YOU:  Understand these instructions.  Will watch your condition.  Will get help right away if you are not doing well or get worse.   This information is not intended to replace advice given to you by your health care provider. Make sure you discuss any questions you have with your health care provider.   Document Released: 06/02/2000 Document Revised: 05/22/2012 Document Reviewed: 04/09/2012 Elsevier Interactive Patient Education 2016 [employer]   Use  the prescription meds as directed. [facility] with your provider or [facility] as needed.

## 2015-11-29 NOTE — ED Notes (Signed)
Pt reports having rectal swelling and pain for the past 2 weeks.Pt reports smal amount of bleeding after BM. Pt verbalized rectum has been itchy for the ast 2 weeks. Pt reports taking ibuprofen without relief. Pain is reported to be constant. Pt reports pain does not increase with a BM.

## 2019-04-17 ENCOUNTER — Other Ambulatory Visit: Payer: Self-pay

## 2019-04-17 ENCOUNTER — Emergency Department
Admission: EM | Admit: 2019-04-17 | Discharge: 2019-04-17 | Disposition: A | Discharge status: Home / Self Care | Payer: Self-pay | Attending: Emergency Medicine | Admitting: Emergency Medicine

## 2019-04-17 ENCOUNTER — Encounter: Payer: Self-pay | Admitting: Intensive Care

## 2019-04-17 DIAGNOSIS — E1165 Type 2 diabetes mellitus with hyperglycemia: Secondary | ICD-10-CM | POA: Insufficient documentation

## 2019-04-17 DIAGNOSIS — I10 Essential (primary) hypertension: Secondary | ICD-10-CM | POA: Insufficient documentation

## 2019-04-17 DIAGNOSIS — R739 Hyperglycemia, unspecified: Secondary | ICD-10-CM

## 2019-04-17 DIAGNOSIS — Z7984 Long term (current) use of oral hypoglycemic drugs: Secondary | ICD-10-CM | POA: Insufficient documentation

## 2019-04-17 DIAGNOSIS — F17210 Nicotine dependence, cigarettes, uncomplicated: Secondary | ICD-10-CM | POA: Insufficient documentation

## 2019-04-17 LAB — GLUCOSE, CAPILLARY
Glucose-Capillary: 452 mg/dL — ABNORMAL HIGH (ref 70–99)
Glucose-Capillary: 380 mg/dL — ABNORMAL HIGH (ref 70–99)

## 2019-04-17 LAB — URINALYSIS, COMPLETE (UACMP) WITH MICROSCOPIC
Bacteria, UA: NONE SEEN
Bilirubin Urine: NEGATIVE
Glucose, UA: >=500 mg/dL — AB
Hgb urine dipstick: NEGATIVE
Ketones, ur: 5 mg/dL — AB
Leukocytes,Ua: NEGATIVE
Nitrite: NEGATIVE
Protein, ur: NEGATIVE mg/dL
Specific Gravity, Urine: 1.033 — ABNORMAL HIGH (ref 1.005–1.030)
pH: 6 (ref 5.0–8.0)

## 2019-04-17 LAB — BASIC METABOLIC PANEL
Anion gap: 15 (ref 5–15)
BUN: 12 mg/dL (ref 6–20)
CO2: 23 mmol/L (ref 22–32)
Calcium: 9.7 mg/dL (ref 8.9–10.3)
Chloride: 96 mmol/L — ABNORMAL LOW (ref 98–111)
Creatinine, Ser: 0.95 mg/dL (ref 0.61–1.24)
GFR calc Af Amer: >60 mL/min (ref >60)
GFR calc non Af Amer: >60 mL/min (ref >60)
Glucose, Bld: 469 mg/dL — ABNORMAL HIGH (ref 70–99)
Potassium: 3.8 mmol/L (ref 3.5–5.1)
Sodium: 134 mmol/L — ABNORMAL LOW (ref 135–145)

## 2019-04-17 LAB — CBC
HCT: 38.8 % — ABNORMAL LOW (ref 39.0–52.0)
Hemoglobin: 13.2 g/dL (ref 13.0–17.0)
MCH: 23.7 pg — ABNORMAL LOW (ref 26.0–34.0)
MCHC: 34 g/dL (ref 30.0–36.0)
MCV: 69.5 fL — ABNORMAL LOW (ref 80.0–100.0)
Platelets: 217 10*3/uL (ref 150–400)
RBC: 5.58 MIL/uL (ref 4.22–5.81)
RDW: 12.9 % (ref 11.5–15.5)
WBC: 10.7 10*3/uL — ABNORMAL HIGH (ref 4.0–10.5)
nRBC: 0 % (ref 0.0–0.2)

## 2019-04-17 MED ORDER — GLIPIZIDE 10 MG PO TABS: 20.0000 mg | ORAL_TABLET | Freq: Two times a day (BID) | ORAL | 1 refills | Status: DC

## 2019-04-17 MED ORDER — GLIPIZIDE 10 MG PO TABS: 10.0000 mg | ORAL_TABLET | Freq: Two times a day (BID) | ORAL | 1 refills | Status: DC

## 2019-04-17 MED ORDER — SODIUM CHLORIDE 0.9 % IV SOLN
Freq: Once | INTRAVENOUS | Status: AC
  Administered 2019-04-17: 17:00:00 via INTRAVENOUS

## 2019-04-17 NOTE — ED Triage Notes (Signed)
Patient c/o headaches, urinating frequently, and extreme thirst the past week. Patient reports blood sugar of 500 at home. Blood sugar 498 in triage. Normally takes metformin and glipizide at home daily. Ran out of glipizide X1 week ago.

## 2019-04-17 NOTE — ED Notes (Signed)
This RN brought pt to room. Pt states c/o headache, urinating frequently, and extreme thirst in the past week. Pt states he is own metformin and glipizide at home. Pt states he hasn't taken his glipizide in a week due to being out. Pt given warm blanket.

## 2019-04-17 NOTE — ED Provider Notes (Signed)
[facility] Emergency Department Provider Note   ____________________________________________   First MD Initiated Contact with Patient 04/17/19 1704     (approximate)  I have reviewed the triage vital signs and the nursing notes.   HISTORY  Chief Complaint Hyperglycemia    HPI Gregory Gibson is a 43 y.o. male with past medical history of hypertension and diabetes who presents to the ED complaining of hyperglycemia.  Patient reports that he noticed increasing thirst as well as frequent urination starting near the middle of last week.  He is also been feeling increasingly fatigued.  When he checked his blood glucose earlier today, he noted to be greater than 400.  He takes Metformin as well as glipizide usually for his diabetes, but admits to running out of glipizide last week.  He denies any fevers, chills, cough, chest pain, shortness of breath, dysuria, hematuria, or flank pain.        Past Medical History:  Diagnosis Date  . Diabetes mellitus without complication ([place])   . Hypertension     There are no active problems to display for this patient.   History reviewed. No pertinent surgical history.  Prior to Admission medications   Medication Sig Start Date End Date Taking? Authorizing Provider  dibucaine (NUPERCAINAL) 1 % OINT Place 1 application rectally 3 (three) times daily as needed for pain. 11/29/15   Menshew, Dannielle Karvonen, PA-C  glipiZIDE (GLUCOTROL) 10 MG tablet Take 1 tablet (10 mg total) by mouth 2 (two) times daily before a meal. 04/17/19 04/16/20  Blake Divine, MD  HYDROcodone-acetaminophen (NORCO) 5-325 MG tablet Take 1 tablet by mouth every 6 (six) hours as needed for moderate pain. 11/29/15   Menshew, Dannielle Karvonen, PA-C  metFORMIN (GLUCOPHAGE) 1000 MG tablet Take 1 tablet (1,000 mg total) by mouth 2 (two) times daily with a meal. 09/26/15 09/25/16  Earleen [place], MD    Allergies Patient has no known allergies.  History  reviewed. No pertinent family history.  Social History Social History   Tobacco Use  . Smoking status: Current Every Day Smoker    Types: Cigarettes  . Smokeless tobacco: Never Used  Substance Use Topics  . Alcohol use: Yes    Alcohol/week: 14.0 standard drinks    Types: 7 Cans of beer, 7 Shots of liquor per week  . Drug use: Never    Review of Systems  Constitutional: No fever/chills.  Positive for fatigue. Eyes: No visual changes. ENT: No sore throat. Cardiovascular: Denies chest pain. Respiratory: Denies shortness of breath. Gastrointestinal: No abdominal pain.  No nausea, no vomiting.  No diarrhea.  No constipation. Genitourinary: Negative for dysuria.  Positive for polyuria. Musculoskeletal: Negative for back pain. Skin: Negative for rash. Neurological: Negative for headaches, focal weakness or numbness.  ____________________________________________   PHYSICAL EXAM:  VITAL SIGNS: ED Triage Vitals  Enc Vitals Group     BP 04/17/19 1646 140/66     Pulse Rate 04/17/19 1646 (!) 108     Resp 04/17/19 1646 16     Temp 04/17/19 1646 98.2 F (36.8 C)     Temp Source 04/17/19 1646 Oral     SpO2 04/17/19 1646 97 %     Weight 04/17/19 1648 190 lb (86.2 kg)     Height 04/17/19 1648 5\' 11"  (1.803 m)     Head Circumference --      Peak Flow --      Pain Score 04/17/19 1646 0  Pain Loc --      Pain Edu? --      Excl. in GC? --     Constitutional: Alert and oriented. Eyes: Conjunctivae are normal. Head: Atraumatic. Nose: No congestion/rhinnorhea. Mouth/Throat: Mucous membranes are moist. Neck: Normal ROM Cardiovascular: Normal rate, regular rhythm. Grossly normal heart sounds. Respiratory: Normal respiratory effort.  No retractions. Lungs CTAB. Gastrointestinal: Soft and nontender. No distention. Genitourinary: deferred Musculoskeletal: No lower extremity tenderness nor edema. Neurologic:  Normal speech and language. No gross focal neurologic deficits are  appreciated. Skin:  Skin is warm, dry and intact. No rash noted. Psychiatric: Mood and affect are normal. Speech and behavior are normal.  ____________________________________________   LABS (all labs ordered are listed, but only abnormal results are displayed)  Labs Reviewed  GLUCOSE, CAPILLARY - Abnormal; Notable for the following components:      Result Value   Glucose-Capillary 452 (*)    All other components within normal limits  BASIC METABOLIC PANEL - Abnormal; Notable for the following components:   Sodium 134 (*)    Chloride 96 (*)    Glucose, Bld 469 (*)    All other components within normal limits  CBC - Abnormal; Notable for the following components:   WBC 10.7 (*)    HCT 38.8 (*)    MCV 69.5 (*)    MCH 23.7 (*)    All other components within normal limits  URINALYSIS, COMPLETE (UACMP) WITH MICROSCOPIC - Abnormal; Notable for the following components:   Color, Urine STRAW (*)    APPearance CLEAR (*)    Specific Gravity, Urine 1.033 (*)    Glucose, UA >=500 (*)    Ketones, ur 5 (*)    All other components within normal limits  GLUCOSE, CAPILLARY - Abnormal; Notable for the following components:   Glucose-Capillary 380 (*)    All other components within normal limits  CBG MONITORING, ED     PROCEDURES  Procedure(s) performed (including Critical Care):  Procedures   ____________________________________________   INITIAL IMPRESSION / ASSESSMENT AND PLAN / ED COURSE       43 year old male with history of hypertension and diabetes presents to the ED with increasing fatigue, polyuria, and polydipsia since last week.  He is hyperglycemic here and admits he has been out of his glipizide recently, but is still taking Metformin.  No symptoms to suggest infectious process, suspect hyperglycemia secondary to medication noncompliance.  No evidence of DKA, will hydrate with IV fluids and plan on refilling his glipizide.  Glucose levels improving following IV fluid  bolus.  UA without evidence of infection and remainder of labs unremarkable.  Patient provided with prescription for glipizide, counseled to follow-up with his PCP and return to the ED for new or worsening symptoms.  Patient agrees with plan.      ____________________________________________   FINAL CLINICAL IMPRESSION(S) / ED DIAGNOSES  Final diagnoses:  Hyperglycemia  Type 2 diabetes mellitus with hyperglycemia, without long-term current use of insulin [facility](HCC)     ED Discharge Orders         Ordered    glipiZIDE (GLUCOTROL) 10 MG tablet  2 times daily before meals,   Status:  Discontinued     04/17/19 1906    glipiZIDE (GLUCOTROL) 10 MG tablet  2 times daily before meals     04/17/19 1907           Note:  This document was prepared using Dragon voice recognition software and may include unintentional dictation errors.  Chesley Noon, MD 04/17/19 2131

## 2019-04-17 NOTE — ED Notes (Signed)
Pt assisted to bathroom

## 2020-08-27 ENCOUNTER — Emergency Department
Admission: EM | Admit: 2020-08-27 | Discharge: 2020-08-27 | Disposition: A | Discharge status: Home / Self Care | Payer: BC Managed Care – PPO | Attending: Emergency Medicine | Admitting: Emergency Medicine

## 2020-08-27 ENCOUNTER — Emergency Department: Payer: BC Managed Care – PPO

## 2020-08-27 ENCOUNTER — Other Ambulatory Visit: Payer: Self-pay

## 2020-08-27 DIAGNOSIS — F17210 Nicotine dependence, cigarettes, uncomplicated: Secondary | ICD-10-CM | POA: Diagnosis not present

## 2020-08-27 DIAGNOSIS — T148XXA Other injury of unspecified body region, initial encounter: Secondary | ICD-10-CM

## 2020-08-27 DIAGNOSIS — Z23 Encounter for immunization: Secondary | ICD-10-CM | POA: Insufficient documentation

## 2020-08-27 DIAGNOSIS — I10 Essential (primary) hypertension: Secondary | ICD-10-CM | POA: Insufficient documentation

## 2020-08-27 DIAGNOSIS — Z7984 Long term (current) use of oral hypoglycemic drugs: Secondary | ICD-10-CM | POA: Insufficient documentation

## 2020-08-27 DIAGNOSIS — S81012A Laceration without foreign body, left knee, initial encounter: Secondary | ICD-10-CM | POA: Insufficient documentation

## 2020-08-27 DIAGNOSIS — S8992XA Unspecified injury of left lower leg, initial encounter: Secondary | ICD-10-CM | POA: Diagnosis present

## 2020-08-27 MED ORDER — LIDOCAINE HCL 1 % IJ SOLN
10.0000 mL | Freq: Once | INTRAMUSCULAR | Status: AC
  Administered 2020-08-27: 10 mL
  Filled 2020-08-27: qty 10

## 2020-08-27 MED ORDER — TETANUS-DIPHTH-ACELL PERTUSSIS 5-2.5-18.5 LF-MCG/0.5 IM SUSY
0.5000 mL | PREFILLED_SYRINGE | Freq: Once | INTRAMUSCULAR | Status: AC
  Administered 2020-08-27: 0.5 mL via INTRAMUSCULAR
  Filled 2020-08-27: qty 0.5

## 2020-08-27 MED ORDER — CEPHALEXIN 500 MG PO CAPS: 500.0000 mg | ORAL_CAPSULE | Freq: Three times a day (TID) | ORAL | 0 refills | Status: AC

## 2020-08-27 MED ORDER — CEPHALEXIN 500 MG PO CAPS: 500.0000 mg | ORAL_CAPSULE | Freq: Three times a day (TID) | ORAL | 0 refills | Status: DC

## 2020-08-27 NOTE — Discharge Instructions (Signed)
Take Keflex three times daily for the next seven days.  Return to the ED with redness of streaking surrounding the wound site.
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
## 2020-10-18 ENCOUNTER — Ambulatory Visit: Payer: Self-pay | Admitting: Hospice and Palliative Medicine

## 2021-05-22 ENCOUNTER — Emergency Department
Admission: EM | Admit: 2021-05-22 | Discharge: 2021-05-22 | Disposition: A | Discharge status: Home / Self Care | Payer: BC Managed Care – PPO | Attending: Emergency Medicine | Admitting: Emergency Medicine

## 2021-05-22 ENCOUNTER — Other Ambulatory Visit: Payer: Self-pay

## 2021-05-22 ENCOUNTER — Emergency Department: Payer: BC Managed Care – PPO

## 2021-05-22 ENCOUNTER — Encounter: Payer: Self-pay | Admitting: Emergency Medicine

## 2021-05-22 DIAGNOSIS — F17210 Nicotine dependence, cigarettes, uncomplicated: Secondary | ICD-10-CM | POA: Diagnosis not present

## 2021-05-22 DIAGNOSIS — E119 Type 2 diabetes mellitus without complications: Secondary | ICD-10-CM | POA: Insufficient documentation

## 2021-05-22 DIAGNOSIS — Y92410 Unspecified street and highway as the place of occurrence of the external cause: Secondary | ICD-10-CM | POA: Insufficient documentation

## 2021-05-22 DIAGNOSIS — Z7984 Long term (current) use of oral hypoglycemic drugs: Secondary | ICD-10-CM | POA: Insufficient documentation

## 2021-05-22 DIAGNOSIS — M4302 Spondylolysis, cervical region: Secondary | ICD-10-CM | POA: Diagnosis not present

## 2021-05-22 DIAGNOSIS — M4300 Spondylolysis, site unspecified: Secondary | ICD-10-CM

## 2021-05-22 DIAGNOSIS — S199XXA Unspecified injury of neck, initial encounter: Secondary | ICD-10-CM | POA: Diagnosis present

## 2021-05-22 DIAGNOSIS — I10 Essential (primary) hypertension: Secondary | ICD-10-CM | POA: Diagnosis not present

## 2021-05-22 DIAGNOSIS — S161XXA Strain of muscle, fascia and tendon at neck level, initial encounter: Secondary | ICD-10-CM | POA: Insufficient documentation

## 2021-05-22 DIAGNOSIS — S39012A Strain of muscle, fascia and tendon of lower back, initial encounter: Secondary | ICD-10-CM | POA: Insufficient documentation

## 2021-05-22 MED ORDER — MELOXICAM 15 MG PO TABS: 15.0000 mg | ORAL_TABLET | Freq: Every day | ORAL | 2 refills | Status: AC

## 2021-05-22 MED ORDER — TIZANIDINE HCL 4 MG PO TABS: 4.0000 mg | ORAL_TABLET | Freq: Three times a day (TID) | ORAL | 0 refills | Status: DC

## 2021-05-22 MED ORDER — HYDROCODONE-ACETAMINOPHEN 5-325 MG PO TABS: 1.0000 | ORAL_TABLET | Freq: Four times a day (QID) | ORAL | 0 refills | Status: AC | PRN
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
## 2021-05-22 NOTE — ED Notes (Signed)
Dc ppw provided to patient. Followup information provided. RX information given. Questions answered. Pt provides verbal consent for discharge. Pt assisted off unit. 

## 2021-05-22 NOTE — ED Notes (Signed)
Vs declined at dc

## 2021-12-13 ENCOUNTER — Emergency Department
Admission: EM | Admit: 2021-12-13 | Discharge: 2021-12-14 | Disposition: A | Discharge status: Home / Self Care | Payer: BC Managed Care – PPO | Attending: Emergency Medicine | Admitting: Emergency Medicine

## 2021-12-13 ENCOUNTER — Other Ambulatory Visit: Payer: Self-pay

## 2021-12-13 DIAGNOSIS — R739 Hyperglycemia, unspecified: Secondary | ICD-10-CM

## 2021-12-13 DIAGNOSIS — E1165 Type 2 diabetes mellitus with hyperglycemia: Secondary | ICD-10-CM | POA: Diagnosis present

## 2021-12-13 DIAGNOSIS — F1010 Alcohol abuse, uncomplicated: Secondary | ICD-10-CM | POA: Diagnosis not present

## 2021-12-13 DIAGNOSIS — Y904 Blood alcohol level of 80-99 mg/100 ml: Secondary | ICD-10-CM | POA: Insufficient documentation

## 2021-12-13 DIAGNOSIS — R7989 Other specified abnormal findings of blood chemistry: Secondary | ICD-10-CM | POA: Diagnosis not present

## 2021-12-13 DIAGNOSIS — I10 Essential (primary) hypertension: Secondary | ICD-10-CM | POA: Insufficient documentation

## 2021-12-13 DIAGNOSIS — N179 Acute kidney failure, unspecified: Secondary | ICD-10-CM | POA: Diagnosis not present

## 2021-12-13 DIAGNOSIS — Z7984 Long term (current) use of oral hypoglycemic drugs: Secondary | ICD-10-CM | POA: Diagnosis not present

## 2021-12-13 LAB — COMPREHENSIVE METABOLIC PANEL
ALT: 16 U/L (ref 0–44)
AST: 17 U/L (ref 15–41)
Albumin: 4.2 g/dL (ref 3.5–5.0)
Alkaline Phosphatase: 64 U/L (ref 38–126)
Anion gap: 9 (ref 5–15)
BUN: 16 mg/dL (ref 6–20)
CO2: 25 mmol/L (ref 22–32)
Calcium: 9.5 mg/dL (ref 8.9–10.3)
Chloride: 100 mmol/L (ref 98–111)
Creatinine, Ser: 1.37 mg/dL — ABNORMAL HIGH (ref 0.61–1.24)
GFR, Estimated: >60 mL/min (ref >60)
Glucose, Bld: 521 mg/dL (ref 70–99)
Potassium: 4.1 mmol/L (ref 3.5–5.1)
Sodium: 134 mmol/L — ABNORMAL LOW (ref 135–145)
Total Bilirubin: 0.5 mg/dL (ref 0.3–1.2)
Total Protein: 8 g/dL (ref 6.5–8.1)

## 2021-12-13 LAB — CBC WITH DIFFERENTIAL/PLATELET
Abs Immature Granulocytes: 0.03 10*3/uL (ref 0.00–0.07)
Basophils Absolute: 0.1 10*3/uL (ref 0.0–0.1)
Basophils Relative: 1 %
Eosinophils Absolute: 0.3 10*3/uL (ref 0.0–0.5)
Eosinophils Relative: 4 %
HCT: 36.6 % — ABNORMAL LOW (ref 39.0–52.0)
Hemoglobin: 12 g/dL — ABNORMAL LOW (ref 13.0–17.0)
Immature Granulocytes: 0 %
Lymphocytes Relative: 18 %
Lymphs Abs: 1.5 10*3/uL (ref 0.7–4.0)
MCH: 22 pg — ABNORMAL LOW (ref 26.0–34.0)
MCHC: 32.8 g/dL (ref 30.0–36.0)
MCV: 67 fL — ABNORMAL LOW (ref 80.0–100.0)
Monocytes Absolute: 0.6 10*3/uL (ref 0.1–1.0)
Monocytes Relative: 7 %
Neutro Abs: 6 10*3/uL (ref 1.7–7.7)
Neutrophils Relative %: 70 %
Platelets: 284 10*3/uL (ref 150–400)
RBC: 5.46 MIL/uL (ref 4.22–5.81)
RDW: 13.8 % (ref 11.5–15.5)
WBC: 8.5 10*3/uL (ref 4.0–10.5)
nRBC: 0 % (ref 0.0–0.2)

## 2021-12-13 LAB — BLOOD GAS, VENOUS
Acid-Base Excess: 1 mmol/L (ref 0.0–2.0)
Bicarbonate: 26 mmol/L (ref 20.0–28.0)
O2 Saturation: 92 %
Patient temperature: 37
pCO2, Ven: 42 mmHg — ABNORMAL LOW (ref 44–60)
pH, Ven: 7.4 (ref 7.25–7.43)
pO2, Ven: 65 mmHg — ABNORMAL HIGH (ref 32–45)

## 2021-12-13 LAB — CK: Total CK: 66 U/L (ref 49–397)

## 2021-12-13 LAB — CBG MONITORING, ED: Glucose-Capillary: 534 mg/dL (ref 70–99)

## 2021-12-13 LAB — BETA-HYDROXYBUTYRIC ACID: Beta-Hydroxybutyric Acid: 0.11 mmol/L (ref 0.05–0.27)

## 2021-12-13 LAB — ETHANOL: Alcohol, Ethyl (B): 97 mg/dL — ABNORMAL HIGH (ref <10)

## 2021-12-13 MED ORDER — LACTATED RINGERS IV BOLUS
1000.0000 mL | Freq: Once | INTRAVENOUS | Status: AC
  Administered 2021-12-13: 1000 mL via INTRAVENOUS

## 2021-12-13 NOTE — ED Provider Notes (Signed)
[facility] Provider Note    Event Date/Time   First MD Initiated Contact with Patient 12/13/21 2306     (approximate)   History   Hyperglycemia   HPI  Gregory Gibson is a 46 y.o. male brought to the ED via EMS from home with a chief complaint of hyperglycemia.  Patient has a history of diabetes on oral hypoglycemics only; no history of DKA.  States he ran out of his medications 1 week ago.  Feels generally weak and sluggish.  Endorses EtOH intake today.  Has been working outdoors in the heat.  Denies associated fever, chills, chest pain, shortness of breath, abdominal pain, nausea, vomiting or dizziness.     Past Medical History   Past Medical History:  Diagnosis Date   Diabetes mellitus without complication (HCC)    Hypertension      Active Problem List  There are no problems to display for this patient.    Past Surgical History  No past surgical history on file.   Home Medications   Prior to Admission medications   Medication Sig Start Date End Date Taking? Authorizing Provider  dibucaine (NUPERCAINAL) 1 % OINT Place 1 application rectally 3 (three) times daily as needed for pain. 11/29/15   Menshew, Charlesetta Ivory, PA-C  glipiZIDE (GLUCOTROL) 10 MG tablet Take 1 tablet (10 mg total) by mouth 2 (two) times daily before a meal. 04/17/19 04/16/20  Chesley Noon, MD  meloxicam (MOBIC) 15 MG tablet Take 1 tablet (15 mg total) by mouth daily. 05/22/21 05/22/22  Triplett, Rulon Eisenmenger B, FNP  metFORMIN (GLUCOPHAGE) 1000 MG tablet Take 1 tablet (1,000 mg total) by mouth 2 (two) times daily with a meal. 09/26/15 09/25/16  Emily Filbert, MD  tiZANidine (ZANAFLEX) 4 MG tablet Take 1 tablet (4 mg total) by mouth 3 (three) times daily. 05/22/21   Chinita Pester, FNP     Allergies  Patient has no known allergies.   Family History  No family history on file.   Physical Exam  Triage Vital Signs: ED Triage Vitals  Enc Vitals Group     BP 12/13/21  2253 (!) 169/97     Pulse Rate 12/13/21 2253 (!) 123     Resp 12/13/21 2253 (!) 22     Temp 12/13/21 2253 98.1 F (36.7 C)     Temp Source 12/13/21 2253 Oral     SpO2 12/13/21 2253 99 %     Weight 12/13/21 2254 175 lb (79.4 kg)     Height 12/13/21 2254 5\' 11"  (1.803 m)     Head Circumference --      Peak Flow --      Pain Score 12/13/21 2254 0     Pain Loc --      Pain Edu? --      Excl. in GC? --     Updated Vital Signs: BP 129/82   Pulse (!) 108   Temp 98.1 F (36.7 C) (Oral)   Resp (!) 25   Ht 5\' 11"  (1.803 m)   Wt 79.4 kg   SpO2 100%   BMI 24.41 kg/m    General: Awake, no distress.  CV:  Tachycardic.  Good peripheral perfusion.  Resp:  Normal effort.  CTA B. Abd:  Nontender.  No distention.  Other:  Calves are nontender and nonswollen.   ED Results / Procedures / Treatments  Labs (all labs ordered are listed, but only abnormal results are displayed) Labs Reviewed  CBC WITH DIFFERENTIAL/PLATELET - Abnormal; Notable for the following components:      Result Value   Hemoglobin 12.0 (*)    HCT 36.6 (*)    MCV 67.0 (*)    MCH 22.0 (*)    All other components within normal limits  COMPREHENSIVE METABOLIC PANEL - Abnormal; Notable for the following components:   Sodium 134 (*)    Glucose, Bld 521 (*)    Creatinine, Ser 1.37 (*)    All other components within normal limits  ETHANOL - Abnormal; Notable for the following components:   Alcohol, Ethyl (B) 97 (*)    All other components within normal limits  BLOOD GAS, VENOUS - Abnormal; Notable for the following components:   pCO2, Ven 42 (*)    pO2, Ven 65 (*)    All other components within normal limits  CBG MONITORING, ED - Abnormal; Notable for the following components:   Glucose-Capillary 534 (*)    All other components within normal limits  CBG MONITORING, ED - Abnormal; Notable for the following components:   Glucose-Capillary 459 (*)    All other components within normal limits  CBG MONITORING, ED -  Abnormal; Notable for the following components:   Glucose-Capillary 450 (*)    All other components within normal limits  BETA-HYDROXYBUTYRIC ACID  CK  URINE DRUG SCREEN, QUALITATIVE ([facility] ONLY)  URINALYSIS, COMPLETE ([facility]MP) WITH MICROSCOPIC     EKG  ED ECG REPORT I, Davonda Ausley J, the attending physician, personally viewed and interpreted this ECG.   Date: 12/14/2021  EKG Time: 2307  Rate: 121  Rhythm: sinus tachycardia  Axis: Normal  Intervals:none  ST&T Change: Nonspecific    RADIOLOGY None   Official radiology report(s): No results found.   PROCEDURES:  Critical Care performed: No  .1-3 Lead EKG Interpretation  Performed by: Irean Hong, MD Authorized by: Irean Hong, MD     Interpretation: abnormal     ECG rate:  110   ECG rate assessment: tachycardic     Rhythm: sinus tachycardia     Ectopy: none     Conduction: normal   Comments:     Patient placed on cardiac monitor to evaluate for arrhythmias    MEDICATIONS ORDERED IN ED: Medications  lactated ringers bolus 1,000 mL (0 mLs Intravenous Stopped 12/14/21 0127)  lactated ringers bolus 1,000 mL (0 mLs Intravenous Stopped 12/14/21 0127)     IMPRESSION / MDM / ASSESSMENT AND PLAN / ED COURSE  I reviewed the triage vital signs and the nursing notes.                             46 year old male presenting with generalized weakness and hyperglycemia.  Differential diagnosis includes but is not limited to hyperglycemia, DKA, infectious, metabolic abnormalities, etc. I have personally reviewed patient's records and see that he had an office visit for cervical radiculopathy in December 2022.  Patient's presentation is most consistent with exacerbation of chronic illness.  The patient is on the cardiac monitor to evaluate for evidence of arrhythmia and/or significant heart rate changes.  Laboratory results demonstrate normal WBC 8.5, hyperglycemia without elevation of anion gap with glucose 521, AKI with  creatinine 1.37 which is elevated from his baseline, VBG unremarkable, minimally elevated EtOH.  We will check CK as patient has been working in the heat.  Administer 2 L IV fluids and reassess.  Clinical Course as of 12/14/21 0147  Wed Dec 14, 2021  0146 Patient feeling significantly better, desires to leave.  Glucose down to 450.  I have refilled patient's diabetes medications and have encouraged him to restart these.  He will follow-up closely with his PCP.  Strict return precautions given.  Patient verbalizes understanding and agrees with plan of care. [JS]    Clinical Course User Index [JS] Irean Hong, MD     FINAL CLINICAL IMPRESSION(S) / ED DIAGNOSES   Final diagnoses:  Hyperglycemia     Rx / DC Orders   ED Discharge Orders          Ordered    glipiZIDE (GLUCOTROL) 10 MG tablet  2 times daily before meals        Pending    metFORMIN (GLUCOPHAGE) 1000 MG tablet  2 times daily with meals        Pending             Note:  This document was prepared using Dragon voice recognition software and may include unintentional dictation errors.   Irean Hong, MD 12/14/21 [phone]

## 2021-12-14 LAB — CBG MONITORING, ED
Glucose-Capillary: 450 mg/dL — ABNORMAL HIGH (ref 70–99)
Glucose-Capillary: 459 mg/dL — ABNORMAL HIGH (ref 70–99)

## 2021-12-14 MED ORDER — METFORMIN HCL 1000 MG PO TABS
1000.0000 mg | ORAL_TABLET | Freq: Two times a day (BID) | ORAL | 0 refills | Status: DC
Start: 2021-12-14 — End: 2022-10-24

## 2021-12-14 MED ORDER — GLIPIZIDE 10 MG PO TABS: 10.0000 mg | ORAL_TABLET | Freq: Two times a day (BID) | ORAL | 0 refills | Status: DC

## 2021-12-14 NOTE — ED Notes (Signed)
Pt DC to home. Dc instructions reviewed with all questions answered. Pt verbalizes understanding. IV removed, cath intact, pressure dressing applied. No bleeding noted at site. Pt ambulatory out of dept with steady gait.

## 2021-12-14 NOTE — Discharge Instructions (Signed)
I have refilled your medications; please restart your diabetes medicines.  Drink plenty of fluids daily.  Return to the ER for worsening symptoms, persistent vomiting, difficulty breathing or other concerns.

## 2022-10-05 ENCOUNTER — Other Ambulatory Visit: Payer: Self-pay

## 2022-10-05 ENCOUNTER — Ambulatory Visit (HOSPITAL_COMMUNITY)
Admission: EM | Admit: 2022-10-05 | Discharge: 2022-10-05 | Disposition: A | Discharge status: Home / Self Care | Payer: 59

## 2022-10-05 ENCOUNTER — Encounter: Payer: Self-pay | Admitting: Emergency Medicine

## 2022-10-05 ENCOUNTER — Ambulatory Visit
Admission: EM | Admit: 2022-10-05 | Discharge: 2022-10-05 | Disposition: A | Discharge status: Home / Self Care | Payer: Managed Care, Other (non HMO)

## 2022-10-05 DIAGNOSIS — Z79899 Other long term (current) drug therapy: Secondary | ICD-10-CM

## 2022-10-05 DIAGNOSIS — R21 Rash and other nonspecific skin eruption: Secondary | ICD-10-CM

## 2022-10-05 NOTE — ED Provider Notes (Signed)
Renaldo Fiddler    CSN: [id number] Arrival date & time: 10/05/22  1352      History   Chief Complaint Chief Complaint  Patient presents with   Rash    HPI JAROLD MACOMBER is a 47 y.o. male.    Rash   Patient presents to urgent care with complaint of chronic rash.  He reports erythema on his neck, upper extremities bilaterally, stomach.  Rash is pruritic and symptoms are worsened by exposure to the sun.  He has been using lotion and steroid cream as recommended by his dermatologist.  Vaughan Sine also prescribed Dupixent which was ineffective.  Patient believes his symptoms are related to a supplement that he has been taking as symptoms started shortly after initiation of this.  Patient has been using "Zaza extra strength".    Past Medical History:  Diagnosis Date   Diabetes mellitus without complication    Hypertension     There are no problems to display for this patient.   Past Surgical History:  Procedure Laterality Date   APPENDECTOMY         Home Medications    Prior to Admission medications   Medication Sig Start Date End Date Taking? Authorizing Provider  lisinopril (ZESTRIL) 2.5 MG tablet Take by mouth daily.   Yes [provider]  lovastatin (ALTOPREV) 20 MG 24 hr tablet Take 20 mg by mouth at bedtime. Dose not know dose   Yes [provider]  dibucaine (NUPERCAINAL) 1 % OINT Place 1 application rectally 3 (three) times daily as needed for pain. Patient not taking: Reported on 10/05/2022 11/29/15   Menshew, Charlesetta Ivory, PA-C  glipiZIDE (GLUCOTROL) 10 MG tablet Take 1 tablet (10 mg total) by mouth 2 (two) times daily before a meal. 12/14/21 01/13/22  Irean Hong, MD  metFORMIN (GLUCOPHAGE) 1000 MG tablet Take 1 tablet (1,000 mg total) by mouth 2 (two) times daily with a meal. 12/14/21 12/14/22  Irean Hong, MD  tiZANidine (ZANAFLEX) 4 MG tablet Take 1 tablet (4 mg total) by mouth 3 (three) times daily. Patient not taking: Reported on  10/05/2022 05/22/21   Chinita Pester, FNP    Family History History reviewed. No pertinent family history.  Social History Social History   Tobacco Use   Smoking status: Every Day    Types: Cigarettes   Smokeless tobacco: Never  Vaping Use   Vaping Use: Some days  Substance Use Topics   Alcohol use: Yes    Alcohol/week: 14.0 standard drinks of alcohol    Types: 7 Cans of beer, 7 Shots of liquor per week   Drug use: Never     Allergies   Patient has no known allergies.   Review of Systems Review of Systems  Skin:  Positive for rash.     Physical Exam Triage Vital Signs ED Triage Vitals  Enc Vitals Group     BP 10/05/22 1430 (!) 150/87     Pulse Rate 10/05/22 1430 94     Resp 10/05/22 1430 18     Temp 10/05/22 1430 98.9 F (37.2 C)     Temp Source 10/05/22 1430 Oral     SpO2 10/05/22 1430 98 %     Weight --      Height --      Head Circumference --      Peak Flow --      Pain Score 10/05/22 1422 8     Pain Loc --  Pain Edu? --      Excl. in GC? --    No data found.  Updated Vital Signs BP (!) 150/87 (BP Location: Left Arm)   Pulse 94   Temp 98.9 F (37.2 C) (Oral)   Resp 18   SpO2 98%   Visual Acuity Right Eye Distance:   Left Eye Distance:   Bilateral Distance:    Right Eye Near:   Left Eye Near:    Bilateral Near:     Physical Exam Vitals reviewed.  Constitutional:      Appearance: Normal appearance.  Skin:    General: Skin is warm and dry.     Findings: Erythema and rash present.  Neurological:     General: No focal deficit present.     Mental Status: He is alert and oriented to person, place, and time.  Psychiatric:        Mood and Affect: Mood normal.        Behavior: Behavior normal.      UC Treatments / Results  Labs (all labs ordered are listed, but only abnormal results are displayed) Labs Reviewed - No data to display  EKG   Radiology No results found.  Procedures Procedures (including critical care  time)  Medications Ordered in UC Medications - No data to display  Initial Impression / Assessment and Plan / UC Course  I have reviewed the triage vital signs and the nursing notes.  Pertinent labs & imaging results that were available during my care of the patient were reviewed by me and considered in my medical decision making (see chart for details).   Patient's arms bilaterally are very erythematous.  Informed patient that withdrawal from this "supplement" will require long-term treatment and following by a provider which is not a service we can perform in urgent care.  I provided a pamphlet for [facility] and recommended he be seen there.  Also asked him to follow-up with his dermatologist in the event his symptoms do not resolve once the supplement is out of his system.  Counseled patient on potential for adverse effects with medications prescribed/recommended today, ER and return-to-clinic precautions discussed, patient verbalized understanding and agreement with care plan.   Final Clinical Impressions(s) / UC Diagnoses   Final diagnoses:  Rash and nonspecific skin eruption     Discharge Instructions      Up at behavioral health clinic for assistance with withdrawal from tianeptine.      ED Prescriptions   None    PDMP not reviewed this encounter.   Charma Igo, [place] 10/05/22 1449

## 2022-10-05 NOTE — Discharge Instructions (Signed)
Up at behavioral health clinic for assistance with withdrawal from tianeptine.

## 2022-10-05 NOTE — ED Provider Notes (Signed)
Behavioral Health Urgent Care Medical Screening Exam  Patient Name: Gregory Gibson MRN: [id number] Date of Evaluation: 10/05/22 Chief Complaint:   Diagnosis:  Final diagnoses:  OTC drug started    History of Present illness: Gregory Gibson is a 47 y.o. male. Patient presents voluntarily to [facility] behavioral health for walk-in assessment.   Patient is assessed, face-to-face, by nurse practitioner, seated in assessment area, no acute distress. Consulted with provider, Dr.  Lucianne Muss, and chart reviewed on 10/05/2022. He  is alert and oriented, pleasant and cooperative during assessment.   Patient  presents with euthymic mood, congruent affect. He  denies suicidal and homicidal ideations. Denies history of suicide attempts, denies history of self-harm.  Patient easily  contracts verbally for safety with this Clinical research associate.  Patient presents after being seen in urgent care for itching skin earlier this date.  Urgent care encourage patient to seek assessment.  Patient reports she has recently been using more of an over-the-counter supplement, Zaza, then directed on label.  Zaza potential ingredients: combrelum quadrangular leaf, tianeptine and piper methysticum, as listed on label.  Patient began using kratom supplements for pain approximately 2 years ago.  He states "I got hooked on it so I switched to zaza about 3 months ago."  Patient reports he felt that he was overusing this supplement that he purchases at a vape shop.  When he stopped using the medication reports increase in itching.  Patient reports ongoing itching times several weeks.  He has used up to 4 to 5 tablets daily.  Cut amount of tablets to 2 to 3 pills each day 1 week ago.  He stopped using the supplement for 3 days 1 week ago, reported increase in itching.    Patient has normal speech and behavior.  He  denies auditory and visual hallucinations.  Patient is able to converse coherently with goal-directed thoughts and no distractibility  or preoccupation.  Denies symptoms of paranoia.  Objectively there is no evidence of psychosis/mania or delusional thinking.  Gregory Gibson resides in [place] with wife and two children. He denies access to weapons. He is employed in [employer].  Patient endorses average sleep and appetite. HE endorses intermittent alcohol use. Typically consumes an average of five drinks 1-2 times per week. He denies substance use aside from alcohol.   Patient offered support and encouragement.  He declines any person to contact for collateral information.  Reports plan to follow-up with primary care provider if itching continues.   Patient educated and verbalizes understanding of mental health resources and other crisis services in the community. They are instructed to call 911 and present to the nearest emergency room should patient experience any suicidal/homicidal ideation, auditory/visual/hallucinations, or detrimental worsening of mental health condition.     Flowsheet Row ED from 10/05/2022 in [facility] Most recent reading at 10/05/2022  4:06 PM ED from 10/05/2022 in [facility] at [facility]  Most recent reading at 10/05/2022  2:27 PM ED from 12/13/2021 in [facility] Emergency Department at [facility] Most recent reading at 12/13/2021 10:56 PM  C-SSRS RISK CATEGORY No Risk No Risk No Risk       Psychiatric Specialty Exam  Presentation  General Appearance:Appropriate for Environment; Casual  Eye Contact:Good  Speech:Clear and Coherent; Normal Rate  Speech Volume:Normal  Handedness:Right   Mood and Affect  Mood: Euthymic  Affect: Appropriate   Thought Process  Thought Processes: Coherent; Goal Directed; Linear  Descriptions of Associations:Intact  Orientation:Full (Time,  Place and Person)  Thought Content:Logical    Hallucinations:None  Ideas of Reference:None  Suicidal Thoughts:No  Homicidal Thoughts:No   Sensorium   Memory: Immediate Good; Recent Good  Judgment: Good  Insight: Good   Executive Functions  Concentration: Good  Attention Span: Good  Recall: Good  Fund of Knowledge: Good  Language: Good   Psychomotor Activity  Psychomotor Activity: Normal   Assets  Assets: Communication Skills; Desire for Improvement; Financial Resources/Insurance; Housing; Leisure Time; Physical Health; Resilience; Social Support   Sleep  Sleep: Fair  Number of hours: No data recorded  Physical Exam: Physical Exam Vitals and nursing note reviewed.  Constitutional:      Appearance: Normal appearance. He is well-developed and normal weight.  HENT:     Head: Normocephalic.  Cardiovascular:     Rate and Rhythm: Normal rate.  Pulmonary:     Effort: Pulmonary effort is normal.  Musculoskeletal:        General: Normal range of motion.     Cervical back: Normal range of motion.  Skin:    General: Skin is warm and dry.  Neurological:     Mental Status: He is alert and oriented to person, place, and time.  Psychiatric:        Attention and Perception: Attention and perception normal.        Mood and Affect: Mood and affect normal.        Speech: Speech normal.        Behavior: Behavior normal. Behavior is cooperative.        Thought Content: Thought content normal.        Cognition and Memory: Cognition and memory normal.    Review of Systems  Constitutional: Negative.   HENT: Negative.    Eyes: Negative.   Respiratory: Negative.    Cardiovascular: Negative.   Gastrointestinal: Negative.   Genitourinary: Negative.   Musculoskeletal: Negative.   Skin: Negative.   Neurological: Negative.   Psychiatric/Behavioral: Negative.     Blood pressure (!) 147/99, pulse 91, temperature 98 F (36.7 C), temperature source Oral, resp. rate 19, SpO2 100 %. There is no height or weight on file to calculate BMI.  Musculoskeletal: Strength & Muscle Tone: within normal limits Gait &  Station: normal Patient leans: N/A   [facility] MSE Discharge Disposition for Follow up and Recommendations: Based on my evaluation the patient does not appear to have an emergency medical condition and can be discharged with resources and follow up care in outpatient services for Individual Therapy Follow up with established primary care provider. Follow-up with outpatient psychiatry resources provided.  Lenard Lance, FNP 10/05/2022, 6:54 PM

## 2022-10-05 NOTE — ED Notes (Signed)
Patient discharged to home with written and verbal instructions. Resources provided.

## 2022-10-05 NOTE — BH Assessment (Addendum)
Articles on Zaza  [email or web address]  [email or web address]

## 2022-10-05 NOTE — Progress Notes (Signed)
   10/05/22 1559  [facility] Triage Screening (Walk-ins at [facility] only)  How Did You Hear About [place]? Other (Comment) (urgent care)  What Is the Reason for Your Visit/Call Today? Gregory Gibson is a 47 year old male presenting to [facility] with chief complaint of having side affects to drugs he bought from a store. About 6 months ago patient started using liquid and pill form of kratom and it started breaking him out with soars and his skin was getting darker so he switched to taking Zaza pills and for awhile his skin cleared up but after awhile the rash came back. Patient noticed when he wasn't taking it he felt like needles was in his arm, twitching, irritable, snappy, depressed and reports he felt wired. Patient reports taking about 2-3 pills a day for the past three months. Patient reports today was looking at a news articles that mentioned the side affects and issues of taking [name] so he went to an urgent care and they sent him here for treatment. Patient took 2 pills at 7am this morning and another pill at 1pm. Pt denies SI, HI, AVH and denies using any other drugs or alcohol.  How Long Has This Been Causing You Problems? > than 6 months  Have You Recently Had Any Thoughts About Hurting Yourself? No  Are You Planning to Commit Suicide/Harm Yourself At This time? No  Have you Recently Had Thoughts About Hurting Someone Karolee Ohs? No  Are You Planning To Harm Someone At This Time? No  Are you currently experiencing any auditory, visual or other hallucinations? No  Have You Used Any Alcohol or Drugs in the Past 24 Hours? No  Do you have any current medical co-morbidities that require immediate attention? Yes  Please describe current medical co-morbidities that require immediate attention: skin red and itching  Clinician description of patient physical appearance/behavior: calm  What Do You Feel Would Help You the Most Today? Alcohol or Drug Use Treatment  If access to [facility] was not available, would you have  sought care in the Emergency Department? No  Determination of Need Routine (7 days)  Options For Referral Facility-Based Crisis

## 2022-10-05 NOTE — ED Triage Notes (Addendum)
Rash for one year .  Area on back of neck, both arms, reports small areas on stomach. Rash itches and the sun irritates skin.  Uses lotion, steroid cream-seen by dermatologist .   Has been on dupixent.   Patient thinks this is related to some pills he is taken.    Patient has been taking ZA ZA extra strength supplement-thinks this is what causes rash Dermatologist says this is eczema, dupixant shots did not help. Patient believes rash is caused by supplement and is requesting something to help get off supplement and prevent withdrawal

## 2022-10-05 NOTE — Discharge Instructions (Signed)

## 2022-10-24 ENCOUNTER — Observation Stay: Payer: Managed Care, Other (non HMO)

## 2022-10-24 ENCOUNTER — Other Ambulatory Visit: Payer: Self-pay

## 2022-10-24 ENCOUNTER — Emergency Department: Payer: Managed Care, Other (non HMO)

## 2022-10-24 ENCOUNTER — Inpatient Hospital Stay
Admission: EM | Admit: 2022-10-24 | Discharge: 2022-10-30 | DRG: 638 | Disposition: A | Discharge status: Home / Self Care | Payer: Managed Care, Other (non HMO) | Attending: Internal Medicine | Admitting: Internal Medicine

## 2022-10-24 DIAGNOSIS — E119 Type 2 diabetes mellitus without complications: Secondary | ICD-10-CM

## 2022-10-24 DIAGNOSIS — L02511 Cutaneous abscess of right hand: Secondary | ICD-10-CM

## 2022-10-24 DIAGNOSIS — L309 Dermatitis, unspecified: Secondary | ICD-10-CM | POA: Insufficient documentation

## 2022-10-24 DIAGNOSIS — I10 Essential (primary) hypertension: Secondary | ICD-10-CM | POA: Insufficient documentation

## 2022-10-24 DIAGNOSIS — M659 Synovitis and tenosynovitis, unspecified: Secondary | ICD-10-CM | POA: Diagnosis present

## 2022-10-24 DIAGNOSIS — E11628 Type 2 diabetes mellitus with other skin complications: Secondary | ICD-10-CM | POA: Diagnosis not present

## 2022-10-24 DIAGNOSIS — L03113 Cellulitis of right upper limb: Secondary | ICD-10-CM | POA: Diagnosis not present

## 2022-10-24 DIAGNOSIS — B9561 Methicillin susceptible Staphylococcus aureus infection as the cause of diseases classified elsewhere: Secondary | ICD-10-CM | POA: Diagnosis present

## 2022-10-24 DIAGNOSIS — E1165 Type 2 diabetes mellitus with hyperglycemia: Secondary | ICD-10-CM | POA: Insufficient documentation

## 2022-10-24 DIAGNOSIS — F32A Depression, unspecified: Secondary | ICD-10-CM | POA: Insufficient documentation

## 2022-10-24 DIAGNOSIS — M65949 Unspecified synovitis and tenosynovitis, unspecified hand: Secondary | ICD-10-CM | POA: Diagnosis present

## 2022-10-24 DIAGNOSIS — M7989 Other specified soft tissue disorders: Secondary | ICD-10-CM | POA: Diagnosis present

## 2022-10-24 DIAGNOSIS — M65141 Other infective (teno)synovitis, right hand: Secondary | ICD-10-CM | POA: Diagnosis present

## 2022-10-24 DIAGNOSIS — E785 Hyperlipidemia, unspecified: Secondary | ICD-10-CM | POA: Diagnosis present

## 2022-10-24 DIAGNOSIS — Z79899 Other long term (current) drug therapy: Secondary | ICD-10-CM

## 2022-10-24 DIAGNOSIS — L03119 Cellulitis of unspecified part of limb: Secondary | ICD-10-CM | POA: Diagnosis not present

## 2022-10-24 DIAGNOSIS — Z7984 Long term (current) use of oral hypoglycemic drugs: Secondary | ICD-10-CM

## 2022-10-24 DIAGNOSIS — F17210 Nicotine dependence, cigarettes, uncomplicated: Secondary | ICD-10-CM | POA: Diagnosis present

## 2022-10-24 LAB — CBC WITH DIFFERENTIAL/PLATELET
Abs Immature Granulocytes: 0.06 10*3/uL (ref 0.00–0.07)
Basophils Absolute: 0.1 10*3/uL (ref 0.0–0.1)
Basophils Relative: 0 %
Eosinophils Absolute: 0.9 10*3/uL — ABNORMAL HIGH (ref 0.0–0.5)
Eosinophils Relative: 5 %
HCT: 36.3 % — ABNORMAL LOW (ref 39.0–52.0)
Hemoglobin: 12.1 g/dL — ABNORMAL LOW (ref 13.0–17.0)
Immature Granulocytes: 0 %
Lymphocytes Relative: 16 %
Lymphs Abs: 2.5 10*3/uL (ref 0.7–4.0)
MCH: 21.8 pg — ABNORMAL LOW (ref 26.0–34.0)
MCHC: 33.3 g/dL (ref 30.0–36.0)
MCV: 65.5 fL — ABNORMAL LOW (ref 80.0–100.0)
Monocytes Absolute: 0.9 10*3/uL (ref 0.1–1.0)
Monocytes Relative: 6 %
Neutro Abs: 11.5 10*3/uL — ABNORMAL HIGH (ref 1.7–7.7)
Neutrophils Relative %: 73 %
Platelets: 270 10*3/uL (ref 150–400)
RBC: 5.54 MIL/uL (ref 4.22–5.81)
RDW: 13.9 % (ref 11.5–15.5)
Smear Review: NORMAL
WBC: 16 10*3/uL — ABNORMAL HIGH (ref 4.0–10.5)
nRBC: 0 % (ref 0.0–0.2)

## 2022-10-24 LAB — COMPREHENSIVE METABOLIC PANEL
ALT: 20 U/L (ref 0–44)
AST: 16 U/L (ref 15–41)
Albumin: 3.6 g/dL (ref 3.5–5.0)
Alkaline Phosphatase: 98 U/L (ref 38–126)
Anion gap: 8 (ref 5–15)
BUN: 12 mg/dL (ref 6–20)
CO2: 22 mmol/L (ref 22–32)
Calcium: 8.6 mg/dL — ABNORMAL LOW (ref 8.9–10.3)
Chloride: 99 mmol/L (ref 98–111)
Creatinine, Ser: 0.89 mg/dL (ref 0.61–1.24)
GFR, Estimated: >60 mL/min (ref >60)
Glucose, Bld: 563 mg/dL (ref 70–99)
Potassium: 4.2 mmol/L (ref 3.5–5.1)
Sodium: 129 mmol/L — ABNORMAL LOW (ref 135–145)
Total Bilirubin: 0.6 mg/dL (ref 0.3–1.2)
Total Protein: 6.4 g/dL — ABNORMAL LOW (ref 6.5–8.1)

## 2022-10-24 LAB — URIC ACID: Uric Acid, Serum: 2.7 mg/dL — ABNORMAL LOW (ref 3.7–8.6)

## 2022-10-24 LAB — CBG MONITORING, ED: Glucose-Capillary: 392 mg/dL — ABNORMAL HIGH (ref 70–99)

## 2022-10-24 LAB — LACTIC ACID, PLASMA
Lactic Acid, Venous: 1.5 mmol/L (ref 0.5–1.9)
Lactic Acid, Venous: 1.4 mmol/L (ref 0.5–1.9)

## 2022-10-24 LAB — GLUCOSE, CAPILLARY: Glucose-Capillary: 322 mg/dL — ABNORMAL HIGH (ref 70–99)

## 2022-10-24 MED ORDER — INSULIN ASPART 100 UNIT/ML IJ SOLN
10.0000 [IU] | Freq: Once | INTRAMUSCULAR | Status: AC
  Administered 2022-10-24: 10 [IU] via SUBCUTANEOUS
  Filled 2022-10-24: qty 1

## 2022-10-24 MED ORDER — SODIUM CHLORIDE 0.9 % IV BOLUS
1000.0000 mL | Freq: Once | INTRAVENOUS | Status: AC
  Administered 2022-10-24: 1000 mL via INTRAVENOUS

## 2022-10-24 MED ORDER — ONDANSETRON HCL 4 MG PO TABS: 4.0000 mg | ORAL_TABLET | Freq: Four times a day (QID) | ORAL | Status: DC | PRN

## 2022-10-24 MED ORDER — INSULIN ASPART 100 UNIT/ML IJ SOLN
0.0000 [IU] | Freq: Three times a day (TID) | INTRAMUSCULAR | Status: DC
  Administered 2022-10-25: 11 [IU] via SUBCUTANEOUS
  Administered 2022-10-25: 4 [IU] via SUBCUTANEOUS
  Administered 2022-10-25: 7 [IU] via SUBCUTANEOUS
  Administered 2022-10-26: 15 [IU] via SUBCUTANEOUS
  Administered 2022-10-26: 11 [IU] via SUBCUTANEOUS
  Administered 2022-10-26: 15 [IU] via SUBCUTANEOUS
  Filled 2022-10-24 (×6): qty 1

## 2022-10-24 MED ORDER — VANCOMYCIN HCL 1500 MG/300ML IV SOLN
1500.0000 mg | Freq: Two times a day (BID) | INTRAVENOUS | Status: DC
  Administered 2022-10-25 – 2022-10-27 (×5): 1500 mg via INTRAVENOUS
  Filled 2022-10-24 (×6): qty 300

## 2022-10-24 MED ORDER — VANCOMYCIN HCL 1750 MG/350ML IV SOLN
1750.0000 mg | Freq: Once | INTRAVENOUS | Status: AC
  Administered 2022-10-24: 1750 mg via INTRAVENOUS
  Filled 2022-10-24: qty 350

## 2022-10-24 MED ORDER — MORPHINE SULFATE (PF) 4 MG/ML IV SOLN
4.0000 mg | Freq: Once | INTRAVENOUS | Status: AC
  Administered 2022-10-24: 4 mg via INTRAVENOUS
  Filled 2022-10-24: qty 1

## 2022-10-24 MED ORDER — ENOXAPARIN SODIUM 40 MG/0.4ML IJ SOSY
40.0000 mg | PREFILLED_SYRINGE | INTRAMUSCULAR | Status: DC
  Administered 2022-10-24 – 2022-10-26 (×3): 40 mg via SUBCUTANEOUS
  Filled 2022-10-24 (×3): qty 0.4

## 2022-10-24 MED ORDER — ONDANSETRON HCL 4 MG/2ML IJ SOLN: 4.0000 mg | Freq: Four times a day (QID) | INTRAMUSCULAR | Status: DC | PRN

## 2022-10-24 MED ORDER — ONDANSETRON HCL 4 MG/2ML IJ SOLN
4.0000 mg | Freq: Once | INTRAMUSCULAR | Status: AC
  Administered 2022-10-24: 4 mg via INTRAVENOUS
  Filled 2022-10-24: qty 2

## 2022-10-24 MED ORDER — METRONIDAZOLE 500 MG/100ML IV SOLN
500.0000 mg | Freq: Two times a day (BID) | INTRAVENOUS | Status: DC
  Administered 2022-10-25 – 2022-10-27 (×5): 500 mg via INTRAVENOUS
  Filled 2022-10-24 (×6): qty 100

## 2022-10-24 MED ORDER — SODIUM CHLORIDE 0.9 % IV SOLN
2.0000 g | Freq: Three times a day (TID) | INTRAVENOUS | Status: DC
  Administered 2022-10-25 – 2022-10-29 (×16): 2 g via INTRAVENOUS
  Filled 2022-10-24 (×2): qty 12.5
  Filled 2022-10-24: qty 2
  Filled 2022-10-24: qty 12.5
  Filled 2022-10-24 (×2): qty 2
  Filled 2022-10-24: qty 12.5
  Filled 2022-10-24 (×4): qty 2
  Filled 2022-10-24: qty 12.5
  Filled 2022-10-24 (×3): qty 2
  Filled 2022-10-24: qty 12.5
  Filled 2022-10-24: qty 2

## 2022-10-24 MED ORDER — HYDROMORPHONE HCL 1 MG/ML IJ SOLN
1.0000 mg | INTRAMUSCULAR | Status: DC | PRN
  Administered 2022-10-24 – 2022-10-28 (×16): 1 mg via INTRAVENOUS
  Filled 2022-10-24 (×16): qty 1

## 2022-10-24 MED ORDER — SODIUM CHLORIDE 0.9 % IV SOLN
2.0000 g | Freq: Once | INTRAVENOUS | Status: AC
  Administered 2022-10-24: 2 g via INTRAVENOUS
  Filled 2022-10-24: qty 20

## 2022-10-24 MED ORDER — SODIUM CHLORIDE 0.9% FLUSH
3.0000 mL | Freq: Two times a day (BID) | INTRAVENOUS | Status: DC
  Administered 2022-10-24 – 2022-10-29 (×9): 3 mL via INTRAVENOUS

## 2022-10-24 MED ORDER — INSULIN GLARGINE-YFGN 100 UNIT/ML ~~LOC~~ SOLN
20.0000 [IU] | Freq: Every day | SUBCUTANEOUS | Status: DC
  Administered 2022-10-24 – 2022-10-25 (×2): 20 [IU] via SUBCUTANEOUS
  Filled 2022-10-24 (×3): qty 0.2

## 2022-10-24 NOTE — Assessment & Plan Note (Addendum)
IV antibiotics switched over to oral today

## 2022-10-24 NOTE — Assessment & Plan Note (Addendum)
Switch to insulin with A1c being elevated at 11.8.  Increase Semglee insulin 32 units at night and short acting insulin 10 units prior to meals plus sliding scale.  Upon going home.  Prescription written for diabetic supplies and insulin upon going home.  Can stop oral diabetic medications.

## 2022-10-24 NOTE — ED Provider Notes (Signed)
[facility] Provider Note  Patient Contact: 8:24 PM (approximate)   History   Hand Problem   HPI  Gregory Gibson is a 47 y.o. male with a history of diabetes and hypertension, presents to the emergency department with right hand swelling that started on Monday.  Patient states that the swelling started around the thenar eminence and now occurs along the dorsal aspect of the hand as well.  He denies any history of hand cellulitis in the past.  Patient denies falls or mechanisms of trauma.  Patient does not inject any IV drugs.  He states that he does have a history of chronic eczema and typically has fissures along his hand.  In terms of his diabetes management, patient states that he takes metformin and glipizide daily and states that he has been compliant with his medications.  He does not take insulin.  He states that he has had chills at home but is uncertain of fever.  No nausea, vomiting or abdominal pain.      Physical Exam   Triage Vital Signs: ED Triage Vitals  Enc Vitals Group     BP 10/24/22 1906 (!) 144/93     Pulse Rate 10/24/22 1906 98     Resp 10/24/22 1906 17     Temp 10/24/22 1906 98.6 F (37 C)     Temp Source 10/24/22 1906 Oral     SpO2 10/24/22 1906 100 %     Weight 10/24/22 1907 185 lb (83.9 kg)     Height 10/24/22 1907 5\' 11"  (1.803 m)     Head Circumference --      Peak Flow --      Pain Score 10/24/22 1906 10     Pain Loc --      Pain Edu? --      Excl. in GC? --     Most recent vital signs: Vitals:   10/24/22 1906  BP: (!) 144/93  Pulse: 98  Resp: 17  Temp: 98.6 F (37 C)  SpO2: 100%     General: Alert and in no acute distress. Eyes:  PERRL. EOMI. Head: No acute traumatic findings ENT:      Nose: No congestion/rhinnorhea.      Mouth/Throat: Mucous membranes are moist.  Neck: No stridor. No cervical spine tenderness to palpation. Cardiovascular:  Good peripheral perfusion Respiratory: Normal respiratory effort  without tachypnea or retractions. Lungs CTAB. Good air entry to the bases with no decreased or absent breath sounds. Gastrointestinal: Bowel sounds 4 quadrants. Soft and nontender to palpation. No guarding or rigidity. No palpable masses. No distention. No CVA tenderness. Musculoskeletal: Full range of motion to all extremities.  Palpable radial and ulnar pulses, right.  Capillary refill less than 2 seconds on the right. Neurologic:  No gross focal neurologic deficits are appreciated.  Skin: Patient has erythema of the thenar eminence and edema.  No streaking of the right forearm.   ED Results / Procedures / Treatments   Labs (all labs ordered are listed, but only abnormal results are displayed) Labs Reviewed  COMPREHENSIVE METABOLIC PANEL - Abnormal; Notable for the following components:      Result Value   Sodium 129 (*)    Glucose, Bld 563 (*)    Calcium 8.6 (*)    Total Protein 6.4 (*)    All other components within normal limits  CBC WITH DIFFERENTIAL/PLATELET - Abnormal; Notable for the following components:   WBC 16.0 (*)    Hemoglobin  12.1 (*)    HCT 36.3 (*)    MCV 65.5 (*)    MCH 21.8 (*)    Neutro Abs 11.5 (*)    Eosinophils Absolute 0.9 (*)    All other components within normal limits  CULTURE, BLOOD (ROUTINE X 2)  CULTURE, BLOOD (ROUTINE X 2)  LACTIC ACID, PLASMA  LACTIC ACID, PLASMA  URIC ACID        RADIOLOGY  I personally viewed and evaluated these images as part of my medical decision making, as well as reviewing the written report by the radiologist.  ED Provider Interpretation: No acute abnormality on x-ray of the right hand.    PROCEDURES:  Critical Care performed: No  Procedures   MEDICATIONS ORDERED IN ED: Medications  sodium chloride 0.9 % bolus 1,000 mL (has no administration in time range)  cefTRIAXone (ROCEPHIN) 2 g in sodium chloride 0.9 % 100 mL IVPB (has no administration in time range)  insulin aspart (novoLOG) injection 10 Units  (10 Units Subcutaneous Given 10/24/22 2019)     IMPRESSION / MDM / ASSESSMENT AND PLAN / ED COURSE  I reviewed the triage vital signs and the nursing notes.                              Assessment and plan Hand cellulitis 47 year old male presents to the emergency department with acute right hand pain and swelling.  Vital signs are reassuring at triage.  On exam, patient was alert and nontoxic-appearing.  He had edema over the thenar eminence which radiated to the dorsum of the hand.  No streaking.  Patient has no fusiform swelling of the digits or pain with passive extension.  Patient's white blood cell count elevated at 16 with associated left shift.  Blood glucose 563 with normal anion gap.  Blood cultures in process.  Lactic acid and uric acid within process.  Patient was given 10 units of subcu insulin and normal saline bolus and glucose trended down to 392. Patient was given IV vancomycin and Rocephin and admitted to the hospitalist service under the care of of Dr. Huel Cote      FINAL CLINICAL IMPRESSION(S) / ED DIAGNOSES   Final diagnoses:  Cellulitis of right hand     Rx / DC Orders   ED Discharge Orders     None        Note:  This document was prepared using Dragon voice recognition software and may include unintentional dictation errors.   Pia Mau Trosky, PA-C 10/24/22 2321    Minna Antis, MD 10/26/22 0600

## 2022-10-24 NOTE — ED Triage Notes (Signed)
Pt presents to ER with c/o R hand swelling that pt states happened overnight.  Pt states he did not injure his hand in any way.  Pt reports rash to right arm that is chronic in nature.  No distinct areas of broken skin.  Swelling appears more prominent around his thumb.  Pt reports pain and itching from swollen area.  Pt is otherwise A&O x4 and in NAD in triage.

## 2022-10-24 NOTE — ED Notes (Signed)
Notified Jaclyn Woods P.A. of glucose of 563.

## 2022-10-24 NOTE — Progress Notes (Addendum)
Pharmacy Antibiotic Note  Gregory Gibson is a 47 y.o. male admitted on 10/24/2022 with cellulitis and abscess .  Pharmacy has been consulted for Vancomycin and Cefepime dosing.  Plan: Cefepime 2 gm IV Q8H ordered to start on 5/8 @ ~ 0000.   Vancomycin 1750 mg IV X 1 given on 5/7 @ 2112. Vancomycin 1500 mg IV Q12H ordered to start on 5/8 @ 0900.  AUC = 522.2 Vanc trough = 13.4   Height: 5\' 11"  (180.3 cm) Weight: 83.9 kg (185 lb) IBW/kg (Calculated) : 75.3  Temp (24hrs), Avg:98.4 F (36.9 C), Min:98.2 F (36.8 C), Max:98.6 F (37 C)  Recent Labs  Lab 10/24/22 1912 10/24/22 2019  WBC 16.0*  --   CREATININE 0.89  --   LATICACIDVEN  --  1.4  1.5    Estimated Creatinine Clearance: 109.3 mL/min (by C-G formula based on SCr of 0.89 mg/dL).    No Known Allergies  Antimicrobials this admission:   >>    >>   Dose adjustments this admission:   Microbiology results:  BCx:   UCx:    Sputum:    MRSA PCR:   Thank you for allowing pharmacy to be a part of this patient's care.  Bo Rogue D 10/24/2022 11:13 PM

## 2022-10-24 NOTE — H&P (Addendum)
History and Physical    Patient: Gregory Gibson:[id number] DOB: 18-Nov-1975 DOA: 10/24/2022 DOS: the patient was seen and examined on 10/24/2022 PCP: [facility]  Patient coming from: Home  Chief Complaint:  Chief Complaint  Patient presents with   Hand Problem   HPI: Gregory Gibson is a 47 y.o. male with medical history significant of uncontrolled type 2 diabetes, HTN, severe diffuse eczema on Dupixent, hyperlipidemia, who presents to the ED due to hand swelling and pain.  Gregory Gibson states that yesterday he noticed that his hand was beginning to swell and and painful particularly around the thumb.  Then, his symptoms became suddenly, significantly worse overnight.  Today, he has had severe pain with difficulty moving his fingers due to the pain.  He denies any injuries or trauma to the affected hand.  He endorses chills but denies any fever, nausea, vomiting, diarrhea, chest pain, shortness of breath or palpitations.  He notes a history of severe eczema and has been started on Dupixent without significant improvement.  He reports he has difficulty not scratching his eczema rashes.  ED course: On arrival to the ED, patient was hypertensive at 144/93 with heart rate 98.  He was saturating 100% on room air.  He was afebrile 98.6.  Initial workup notable for WBC of 16.0, hemoglobin 12.1, MCV of 65, sodium of 129, glucose of 563, creatinine 0.89 and GFR above 60.  Lactic acid within normal limits x 2. Hand x-ray was obtained with no osseous abnormalities noted.  Patient started on vancomycin and ceftriaxone.  TRH consulted for admission.  Review of Systems: As mentioned in the history of present illness. All other systems reviewed and are negative.  Past Medical History:  Diagnosis Date   Diabetes mellitus without complication (HCC)    Hypertension    Past Surgical History:  Procedure Laterality Date   APPENDECTOMY     Social History:  reports that he has been smoking  cigarettes. He has never used smokeless tobacco. He reports current alcohol use of about 14.0 standard drinks of alcohol per week. He reports that he does not use drugs.  No Known Allergies  History reviewed. No pertinent family history.  Prior to Admission medications   Medication Sig Start Date End Date Taking? Authorizing Provider  clobetasol cream (TEMOVATE) 0.05 % Apply 1 Application topically 2 (two) times a week. 10/10/22  Yes [provider]  triamcinolone cream (KENALOG) 0.1 % PLEASE SEE ATTACHED FOR DETAILED DIRECTIONS 10/08/22  Yes [provider]  atorvastatin (LIPITOR) 20 MG tablet Take 20 mg by mouth daily. Patient not taking: Reported on 10/24/2022 06/06/22   [provider]  fenofibrate (TRICOR) 145 MG tablet Take 145 mg by mouth daily. Patient not taking: Reported on 10/24/2022 06/06/22   [provider]  glipiZIDE (GLUCOTROL) 10 MG tablet Take 1 tablet (10 mg total) by mouth 2 (two) times daily before a meal. Patient not taking: Reported on 10/24/2022 12/14/21 10/24/22  Irean Hong, MD  JARDIANCE 25 MG TABS tablet Take 25 mg by mouth daily. Patient not taking: Reported on 10/24/2022 06/06/22   [provider]  lisinopril (ZESTRIL) 10 MG tablet Take 10 mg by mouth daily. Patient not taking: Reported on 10/24/2022    [provider]  metFORMIN (GLUCOPHAGE-XR) 500 MG 24 hr tablet Take 1,000 mg by mouth 2 (two) times daily. Patient not taking: Reported on 10/24/2022 06/06/22   [provider]  sertraline (ZOLOFT) 50 MG tablet Take 50  mg by mouth daily. Patient not taking: Reported on 10/24/2022 06/06/22   [provider]  tiZANidine (ZANAFLEX) 4 MG tablet Take 1 tablet (4 mg total) by mouth 3 (three) times daily. Patient not taking: Reported on 10/05/2022 05/22/21   Chinita Pester, FNP    Physical Exam: Vitals:   10/24/22 1906 10/24/22 1907 10/24/22 2310  BP: (!) 144/93  (!) 146/98  Pulse: 98  98  Resp: 17  18   Temp: 98.6 F (37 C)  98.2 F (36.8 C)  TempSrc: Oral    SpO2: 100%  100%  Weight:  83.9 kg   Height:  5\' 11"  (1.803 m)    Physical Exam Vitals and nursing note reviewed.  Constitutional:      General: He is in acute distress (2/2 pain).     Appearance: He is normal weight.  HENT:     Head: Normocephalic and atraumatic.     Mouth/Throat:     Mouth: Mucous membranes are moist.     Pharynx: Oropharynx is clear.  Eyes:     Conjunctiva/sclera: Conjunctivae normal.     Pupils: Pupils are equal, Gibson, and reactive to light.  Cardiovascular:     Rate and Rhythm: Normal rate and regular rhythm.     Heart sounds: No murmur heard.    No gallop.  Pulmonary:     Effort: Pulmonary effort is normal.     Breath sounds: Normal breath sounds.  Musculoskeletal:     Right lower leg: No edema.     Left lower leg: No edema.  Skin:    General: Skin is warm and dry.     Comments: Diffuse eczema involving bilateral hands, arms, and entire face, especially around the periorbital region.  On the right hand the skin fold between the thumb and pointer finger, there is a small abscess approximately 0.5 cm with surrounding erythema.  Please see below  Neurological:     General: No focal deficit present.     Mental Status: He is alert and oriented to person, place, and time. Mental status is at baseline.  Psychiatric:        Mood and Affect: Mood normal.        Behavior: Behavior normal.        Data Reviewed: CBC with WBC of 16.0, hemoglobin 12.1, MCV of 65, platelets of 270 CMP with sodium 129, potassium 4.2, bicarb 22, glucose 563, BUN 12, creatinine 0.89, anion gap 8, AST 16, ALT 20, GFR above 60 Uric acid low at 2.7 Lactic acid within normal limits x 2 Blood cultures pending  DG Hand Complete Right  Result Date: 10/24/2022 CLINICAL DATA:  Hand swelling EXAM: RIGHT HAND - COMPLETE 3 VIEW COMPARISON:  None Available. FINDINGS: No evidence of fracture or dislocation. Moderate  degenerative changes of the distal fourth IP joint. Mild degenerative changes of the fifth carpal metacarpal joint. Soft tissues are unremarkable. IMPRESSION: No acute osseous abnormality. Electronically Signed   By: Allegra Lai M.D.   On: 10/24/2022 19:41    Results are pending, will review when available.  Assessment and Plan:  * Cellulitis of hand Patient is presenting with rapidly progressive right hand swelling, erythema and diffuse pain concerning for severe cellulitis in addition to a very small abscess in the thumb skin fold.  Given extensive nature, will obtain an MRI to assess for deeper soft tissue involvement.  After discussion with pharmacy, given high risk, will expand antimicrobial coverage.  - MRI right hand with  and without contrast pending - Pending results, may require orthopedic surgery consultation - Continue vancomycin per pharmacy dosing - Add on Cefepime for pseudomonal coverage and metronidazole for anaerobic coverage - Blood cultures pending - Discussed with patient the importance of tight blood sugar control and following up with Dermatology for alternative treatments of eczema  Type 2 diabetes mellitus (HCC) Uncontrolled type 2 diabetes with severe hyperglycemia.  Last A1c approximately 15 months ago at 14.2%.  Previously on Jardiance, glipizide, and metformin. Conflicting reports if patient is still taking them.   - A1c pending - Start SSI, resistant - Semglee 20 units at bedtime  Eczema Patient states he is currently on Dupixent without significant improvement. Follows with [facility].   Essential hypertension Patient states he is no longer taking Lisinopril. Last filled in December 2023.  - Hold off on restarting at this time  Advance Care Planning:   Code Status: Full Code verified by patient  Consults: None  Family Communication: Patient's wife updated at bedside  Severity of Illness: The appropriate patient status for this patient  is OBSERVATION. Observation status is judged to be reasonable and necessary in order to provide the required intensity of service to ensure the patient's safety. The patient's presenting symptoms, physical exam findings, and initial radiographic and laboratory data in the context of their medical condition is felt to place them at decreased risk for further clinical deterioration. Furthermore, it is anticipated that the patient will be medically stable for discharge from the hospital within 2 midnights of admission.   Author: Verdene Lennert, MD 10/24/2022 11:15 PM  For on call review [email or web address].

## 2022-10-24 NOTE — Assessment & Plan Note (Signed)
Patient states he is currently on Dupixent without significant improvement. Follows with [facility].

## 2022-10-24 NOTE — Assessment & Plan Note (Addendum)
Restart lisinopril 

## 2022-10-24 NOTE — Consult Note (Signed)
PHARMACY -  BRIEF ANTIBIOTIC NOTE   Pharmacy has received consult(s) for vancomycin from an ED provider.  The patient's profile has been reviewed for ht/wt/allergies/indication/available labs.    One time order(s) placed for Vancomycin 1750 mg   Further antibiotics/pharmacy consults should be ordered by admitting physician if indicated.                       Thank you, Sharen Hones 10/24/2022  8:45 PM

## 2022-10-25 ENCOUNTER — Other Ambulatory Visit (HOSPITAL_COMMUNITY): Payer: Self-pay

## 2022-10-25 ENCOUNTER — Observation Stay: Payer: Managed Care, Other (non HMO)

## 2022-10-25 DIAGNOSIS — L309 Dermatitis, unspecified: Secondary | ICD-10-CM | POA: Diagnosis not present

## 2022-10-25 DIAGNOSIS — M659 Synovitis and tenosynovitis, unspecified: Secondary | ICD-10-CM

## 2022-10-25 DIAGNOSIS — I10 Essential (primary) hypertension: Secondary | ICD-10-CM | POA: Diagnosis present

## 2022-10-25 DIAGNOSIS — Z79899 Other long term (current) drug therapy: Secondary | ICD-10-CM | POA: Diagnosis not present

## 2022-10-25 DIAGNOSIS — L03119 Cellulitis of unspecified part of limb: Secondary | ICD-10-CM | POA: Diagnosis not present

## 2022-10-25 DIAGNOSIS — F32A Depression, unspecified: Secondary | ICD-10-CM | POA: Diagnosis present

## 2022-10-25 DIAGNOSIS — L02511 Cutaneous abscess of right hand: Secondary | ICD-10-CM | POA: Diagnosis present

## 2022-10-25 DIAGNOSIS — E782 Mixed hyperlipidemia: Secondary | ICD-10-CM

## 2022-10-25 DIAGNOSIS — F17210 Nicotine dependence, cigarettes, uncomplicated: Secondary | ICD-10-CM | POA: Diagnosis present

## 2022-10-25 DIAGNOSIS — E1165 Type 2 diabetes mellitus with hyperglycemia: Secondary | ICD-10-CM | POA: Diagnosis not present

## 2022-10-25 DIAGNOSIS — E11628 Type 2 diabetes mellitus with other skin complications: Secondary | ICD-10-CM | POA: Diagnosis present

## 2022-10-25 DIAGNOSIS — M65141 Other infective (teno)synovitis, right hand: Secondary | ICD-10-CM | POA: Diagnosis present

## 2022-10-25 DIAGNOSIS — Z7984 Long term (current) use of oral hypoglycemic drugs: Secondary | ICD-10-CM | POA: Diagnosis not present

## 2022-10-25 DIAGNOSIS — B9561 Methicillin susceptible Staphylococcus aureus infection as the cause of diseases classified elsewhere: Secondary | ICD-10-CM | POA: Diagnosis present

## 2022-10-25 DIAGNOSIS — E785 Hyperlipidemia, unspecified: Secondary | ICD-10-CM | POA: Diagnosis present

## 2022-10-25 DIAGNOSIS — L03113 Cellulitis of right upper limb: Secondary | ICD-10-CM | POA: Diagnosis present

## 2022-10-25 DIAGNOSIS — M7989 Other specified soft tissue disorders: Secondary | ICD-10-CM | POA: Diagnosis present

## 2022-10-25 LAB — BASIC METABOLIC PANEL
Anion gap: 6 (ref 5–15)
BUN: 7 mg/dL (ref 6–20)
CO2: 25 mmol/L (ref 22–32)
Calcium: 8.4 mg/dL — ABNORMAL LOW (ref 8.9–10.3)
Chloride: 102 mmol/L (ref 98–111)
Creatinine, Ser: 0.75 mg/dL (ref 0.61–1.24)
GFR, Estimated: >60 mL/min (ref >60)
Glucose, Bld: 240 mg/dL — ABNORMAL HIGH (ref 70–99)
Potassium: 3.7 mmol/L (ref 3.5–5.1)
Sodium: 133 mmol/L — ABNORMAL LOW (ref 135–145)

## 2022-10-25 LAB — CBC WITH DIFFERENTIAL/PLATELET
Abs Immature Granulocytes: 0.12 10*3/uL — ABNORMAL HIGH (ref 0.00–0.07)
Basophils Absolute: 0.1 10*3/uL (ref 0.0–0.1)
Basophils Relative: 0 %
Eosinophils Absolute: 0.5 10*3/uL (ref 0.0–0.5)
Eosinophils Relative: 3 %
HCT: 34.4 % — ABNORMAL LOW (ref 39.0–52.0)
Hemoglobin: 11.5 g/dL — ABNORMAL LOW (ref 13.0–17.0)
Immature Granulocytes: 1 %
Lymphocytes Relative: 17 %
Lymphs Abs: 2.9 10*3/uL (ref 0.7–4.0)
MCH: 21.9 pg — ABNORMAL LOW (ref 26.0–34.0)
MCHC: 33.4 g/dL (ref 30.0–36.0)
MCV: 65.5 fL — ABNORMAL LOW (ref 80.0–100.0)
Monocytes Absolute: 1.3 10*3/uL — ABNORMAL HIGH (ref 0.1–1.0)
Monocytes Relative: 7 %
Neutro Abs: 12.7 10*3/uL — ABNORMAL HIGH (ref 1.7–7.7)
Neutrophils Relative %: 72 %
Platelets: 252 10*3/uL (ref 150–400)
RBC: 5.25 MIL/uL (ref 4.22–5.81)
RDW: 13.5 % (ref 11.5–15.5)
Smear Review: NORMAL
WBC: 17.6 10*3/uL — ABNORMAL HIGH (ref 4.0–10.5)
nRBC: 0.1 % (ref 0.0–0.2)

## 2022-10-25 LAB — GLUCOSE, CAPILLARY
Glucose-Capillary: 225 mg/dL — ABNORMAL HIGH (ref 70–99)
Glucose-Capillary: 155 mg/dL — ABNORMAL HIGH (ref 70–99)
Glucose-Capillary: 270 mg/dL — ABNORMAL HIGH (ref 70–99)
Glucose-Capillary: 234 mg/dL — ABNORMAL HIGH (ref 70–99)

## 2022-10-25 LAB — HEMOGLOBIN A1C
Hgb A1c MFr Bld: 11.8 % — ABNORMAL HIGH (ref 4.8–5.6)
Mean Plasma Glucose: 291.96 mg/dL

## 2022-10-25 LAB — HIV ANTIBODY (ROUTINE TESTING W REFLEX): HIV Screen 4th Generation wRfx: NONREACTIVE

## 2022-10-25 LAB — CULTURE, BLOOD (ROUTINE X 2)

## 2022-10-25 MED ORDER — GADOBUTROL 1 MMOL/ML IV SOLN
8.0000 mL | Freq: Once | INTRAVENOUS | Status: AC | PRN
  Administered 2022-10-25: 8 mL via INTRAVENOUS

## 2022-10-25 MED ORDER — ATORVASTATIN CALCIUM 20 MG PO TABS
20.0000 mg | ORAL_TABLET | Freq: Every day | ORAL | Status: DC
  Administered 2022-10-25 – 2022-10-30 (×5): 20 mg via ORAL
  Filled 2022-10-25 (×5): qty 1

## 2022-10-25 MED ORDER — FENOFIBRATE 160 MG PO TABS
160.0000 mg | ORAL_TABLET | Freq: Every day | ORAL | Status: DC
  Administered 2022-10-25 – 2022-10-30 (×5): 160 mg via ORAL
  Filled 2022-10-25 (×6): qty 1

## 2022-10-25 MED ORDER — INSULIN ASPART 100 UNIT/ML IJ SOLN
4.0000 [IU] | Freq: Three times a day (TID) | INTRAMUSCULAR | Status: DC
  Administered 2022-10-25 – 2022-10-26 (×4): 4 [IU] via SUBCUTANEOUS
  Filled 2022-10-25 (×4): qty 1

## 2022-10-25 MED ORDER — SERTRALINE HCL 50 MG PO TABS
50.0000 mg | ORAL_TABLET | Freq: Every day | ORAL | Status: DC
  Administered 2022-10-25 – 2022-10-30 (×5): 50 mg via ORAL
  Filled 2022-10-25 (×5): qty 1

## 2022-10-25 MED ORDER — LISINOPRIL 10 MG PO TABS
10.0000 mg | ORAL_TABLET | Freq: Every day | ORAL | Status: DC
  Administered 2022-10-25 – 2022-10-26 (×2): 10 mg via ORAL
  Filled 2022-10-25 (×3): qty 1

## 2022-10-25 NOTE — Plan of Care (Signed)
  Problem: Education: Goal: Ability to describe self-care measures that may prevent or decrease complications (Diabetes Survival Skills Education) will improve Outcome: Progressing   Problem: Skin Integrity: Goal: Risk for impaired skin integrity will decrease Outcome: Progressing   Problem: Pain Managment: Goal: General experience of comfort will improve Outcome: Progressing   Problem: Safety: Goal: Ability to remain free from injury will improve Outcome: Progressing   Problem: Skin Integrity: Goal: Risk for impaired skin integrity will decrease Outcome: Progressing   

## 2022-10-25 NOTE — Assessment & Plan Note (Signed)
On Zoloft. °

## 2022-10-25 NOTE — Assessment & Plan Note (Signed)
Continue Lipitor and Tricor 

## 2022-10-25 NOTE — Inpatient Diabetes Management (Addendum)
Inpatient Diabetes Program Recommendations  AACE/ADA: New Consensus Statement on Inpatient Glycemic Control   Target Ranges:  Prepandial:   less than 140 mg/dL      Peak postprandial:   less than 180 mg/dL (1-2 hours)      Critically ill patients:  140 - 180 mg/dL    Latest Reference Range & Units 10/24/22 22:16 10/24/22 23:15 10/25/22 08:08  Glucose-Capillary 70 - 99 mg/dL 295 (H) 621 (H) 308 (H)    Latest Reference Range & Units 10/24/22 19:12  CO2 22 - 32 mmol/L 22  Glucose 70 - 99 mg/dL 657 (HH)  Anion gap 5 - 15  8    Latest Reference Range & Units 10/24/22 19:11  Hemoglobin A1C 4.8 - 5.6 % 11.8 (H)   Review of Glycemic Control  Diabetes history: DM2 Outpatient Diabetes medications: Glipizide 10 mg BID, Metformin 1000 mg BID, Jardiance 25 mg daily Current orders for Inpatient glycemic control: Semglee 20 units QHS, Novolog 0-20 units TID with meals  Inpatient Diabetes Program Recommendations:    Insulin: Please consider ordering Novolog 0-5 units QHS and Novolog 4 units TID with meals for meal coverage if patient eats at least 50% of meals.  HbgA1C: A1C 11.8% on 10/24/22 indicating an average glucose of 293 mg/dl over the past 2-3 months.  NOTE: Patient admitted with right hand cellulitis. Initial glucose 563 mg/dl on 01/21/68 which was treated with SQ insulin. Patient received Semglee 20 units last night and CBG 225 mg/dl this morning. Per chart, patient has [employer] and PCP is listed as [employer]. Current A1C 11.8% on 10/24/22. Will plan to speak with patient today regarding DM control and A1C.  Addendum 10/25/22@12 :10-Spoke with patient at bedside about diabetes and home regimen for diabetes control. Patient reports being followed by PCP [facility]) for diabetes management and currently taking Glipizide 10 mg BID, Metformin 1000 mg BID, and Jardiance 25 mg daily as an outpatient for diabetes control. Patient reports taking DM medications as prescribed and states he does  not skip or miss doses of his medication. Patient reports that he has never taken insulin as an outpatient. Patient reports checking glucose 2 times per day and that it is usually in the 100's mg/dl. Discussed that initial glucose on 10/24/22 was 563 mg/dl. Patient reports that he had eaten 3 Little Debbie snack cakes and drank a 2 liter regular soda prior to coming to the hospital on 10/24/22.  Inquired about prior A1C and patient reports not being able to recall exact A1C value but notes it was "up a little".  Discussed A1C results (11.8% on 10/24/22) and explained that current A1C indicates an average glucose of 293 mg/dl over the past 2-3 months. Discussed glucose and A1C goals. Discussed importance of checking CBGs and maintaining good CBG control to prevent long-term and short-term complications. Explained how hyperglycemia leads to damage within blood vessels which lead to the common complications seen with uncontrolled diabetes. Stressed to the patient the importance of improving glycemic control to prevent further complications from uncontrolled diabetes. Discussed impact of nutrition, exercise, stress, sickness, and medications on diabetes control.  Encourage patient to cut back or eliminate snack cakes and eliminate regular sodas. Patient states he plans to get back on track with following a diabetic diet.  Patient also notes that he has been talking with the HR person at his employer and they told him about DM program through work and about Dexcom G7 CGM. Patient states he is interested in using Dexcom  G7; informed patient that our team has samples so I would ask attending provider if it would be okay to provide him with a Dexcom G7 sensor sample. Explained that glucose trends would be followed and insulin will be used inpatient for glycemic control and our team would follow up with him about Dexcom G7 and outpatient DM plan.   Patient verbalized understanding of information discussed and reports no further  questions at this time related to diabetes.  Thanks, Orlando Penner, RN, MSN, CDCES Diabetes Coordinator Inpatient Diabetes Program [phone] (Team Pager from 8am to 5pm)

## 2022-10-25 NOTE — Progress Notes (Signed)
   10/25/22 1500  Spiritual Encounters  Type of Visit Initial  Care provided to: Pt and family  Referral source Chaplain assessment  Reason for visit Routine spiritual support  OnCall Visit No  Spiritual Framework  Presenting Themes Community and relationships;Other (comment)  Community/Connection Family;Significant other  Patient Stress Factors Not reviewed  Family Stress Factors Not reviewed  Interventions  Spiritual Care Interventions Made Established relationship of care and support;Compassionate presence;Reflective listening  Spiritual Care Plan  Spiritual Care Issues Still Outstanding No further spiritual care needs at this time (see row info)   Spoke with patient and family advise him that a Chaplain is around 24/7 if needed

## 2022-10-25 NOTE — Consult Note (Signed)
ORTHOPAEDIC CONSULTATION  REQUESTING PHYSICIAN: Alford Highland, MD  Chief Complaint: Right hand swelling  HPI: Gregory Gibson is a 47 y.o. male who presents with right hand swelling.  Patient has a history of uncontrolled diabetes and severe diffuse eczema.  Patient states that he woke up yesterday with significant hand swelling between his thumb and index finger.  He had severe pain as well.  According to his admission note he had chills but no fever nausea vomiting chest pain or shortness of breath.  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  He denies any recent injuries to his right hand and has not noted any break in the skin or open wounds.  Patient states he has noted improvement in the swelling and digital range of motion after starting IV antibiotics here in the hospital.  My has been performed which showed no focal abscess but rather more diffuse cellulitis.  Past Medical History:  Diagnosis Date   Diabetes mellitus without complication (HCC)    Hypertension    Past Surgical History:  Procedure Laterality Date   APPENDECTOMY     Social History   Socioeconomic History   Marital status: Single    Spouse name: Not on file   Number of children: Not on file   Years of education: Not on file   Highest education level: Not on file  Occupational History   Not on file  Tobacco Use   Smoking status: Every Day    Types: Cigarettes   Smokeless tobacco: Never  Vaping Use   Vaping Use: Some days  Substance and Sexual Activity   Alcohol use: Yes    Alcohol/week: 14.0 standard drinks of alcohol    Types: 7 Cans of beer, 7 Shots of liquor per week   Drug use: Never   Sexual activity: Not on file  Other Topics Concern   Not on file  Social History Narrative   Not on file   Social Determinants of Health   Financial Resource Strain: Not on file  Food Insecurity: Not on file  Transportation Needs: Not on file  Physical  Activity: Not on file  Stress: Not on file  Social Connections: Not on file   History reviewed. No pertinent family history. Allergies  Allergen Reactions   Gadavist [Gadobutrol] Nausea And Vomiting    Patient vomits immediately  after injection   Prior to Admission medications   Medication Sig Start Date End Date Taking? Authorizing Provider  atorvastatin (LIPITOR) 20 MG tablet Take 20 mg by mouth daily. 06/06/22  Yes [provider]  clobetasol cream (TEMOVATE) 0.05 % Apply 1 Application topically 2 (two) times a week. 10/10/22  Yes [provider]  fenofibrate (TRICOR) 145 MG tablet Take 145 mg by mouth daily. 06/06/22  Yes [provider]  glipiZIDE (GLUCOTROL) 10 MG tablet Take 1 tablet (10 mg total) by mouth 2 (two) times daily before a meal. 12/14/21 11/01/23 Yes Irean Hong, MD  JARDIANCE 25 MG TABS tablet Take 25 mg by mouth daily. 06/06/22  Yes [provider]  lisinopril (ZESTRIL) 10 MG tablet Take 10 mg by mouth daily.   Yes [provider]  metFORMIN (GLUCOPHAGE-XR) 500 MG 24 hr tablet Take 1,000 mg by mouth 2 (two) times daily. 06/06/22  Yes [provider]  sertraline (ZOLOFT) 50 MG tablet Take 50 mg by mouth daily. 06/06/22  Yes [provider]  triamcinolone cream (KENALOG) 0.1 % PLEASE  SEE ATTACHED FOR DETAILED DIRECTIONS 10/08/22  Yes [provider]  tiZANidine (ZANAFLEX) 4 MG tablet Take 1 tablet (4 mg total) by mouth 3 (three) times daily. Patient not taking: Reported on 10/05/2022 05/22/21   Chinita Pester, FNP   MR HAND RIGHT W WO CONTRAST  Result Date: 10/25/2022 CLINICAL DATA:  Soft tissue infection suspected. Uncontrolled type 2 diabetes. EXAM: MRI OF THE RIGHT HAND WITHOUT AND WITH CONTRAST TECHNIQUE: Multiplanar, multisequence MR imaging of the right hand was performed before and after the administration of intravenous contrast. CONTRAST:  8mL GADAVIST GADOBUTROL 1 MMOL/ML IV SOLN COMPARISON:   Radiographs dated Oct 24, 2022 FINDINGS: Bones/Joint/Cartilage Marrow signal is within normal limits. No evidence of fracture or dislocation. No evidence of osteomyelitis. No appreciable joint effusion. Ligaments Collateral ligaments are intact. Muscles and Tendons Edema along the flexor and extensor tendons. No fluid collection or abscess. Mild edema of the thenar and hypothenar muscles. Soft tissues Heterogeneous soft tissue enhancement about the first web space suspicious for infectious/inflammatory process. No definite abscess or fluid collection. Generalized soft tissue edema about the dorsum of the hand. IMPRESSION: 1. Heterogeneous soft tissue enhancement about the first web space suspicious for infectious/inflammatory process. No definite abscess or fluid collection. 2. Edema along the flexor and extensor tendons suggesting tenosynovitis without fluid collection or abscess. 3. Mild edema of the thenar and hypothenar muscles. No intramuscular fluid collection or abscess. 4. No evidence of osteomyelitis or joint effusion. Electronically Signed   By: Larose Hires D.O.   On: 10/25/2022 09:20   DG Hand Complete Right  Result Date: 10/24/2022 CLINICAL DATA:  Hand swelling EXAM: RIGHT HAND - COMPLETE 3 VIEW COMPARISON:  None Available. FINDINGS: No evidence of fracture or dislocation. Moderate degenerative changes of the distal fourth IP joint. Mild degenerative changes of the fifth carpal metacarpal joint. Soft tissues are unremarkable. IMPRESSION: No acute osseous abnormality. Electronically Signed   By: Allegra Lai M.D.   On: 10/24/2022 19:41    Positive ROS: All other systems have been reviewed and were otherwise negative with the exception of those mentioned in the HPI and as above.  Physical Exam: General: Alert, no acute distress  MUSCULOSKELETAL: Right hand: Patient has swelling in the right hand particularly between the thumb and index finger involving the webbing between these digits.   Patient has tenderness to palpation of the webbing but no focal collection is palpable.  There is no fluctuance or drainage.  Patient has mild swelling in the fingers and moderate swelling in the webbing.  He has soft compressible compartments of the right forearm.  He can flex and extend his fingers without significant pain and states finger movement has improved after starting IV antibiotics.  Patient's fingers well-perfused.  He has intact sensation light touch.  Assessment: Hand cellulitis  Plan: Patient has had no recent injuries but has severe eczema and uncontrolled diabetes both of which likely have contributed to the development of cellulitis in his right hand.  There is no focal abscess on MRI and therefore the amended treatment is IV antibiotics at this time.  There is no plan for surgical intervention given his improvement on antibiotics so far.  Patient will need to have improvement in his diabetes control as well as will need better control of his eczema to avoid potential cellulitis in the future.  Patient should remain on IV antibiotics until his symptoms improve enough to be switched to p.o. antibiotics.  I will continue to follow while the patient  is in the hospital.   Juanell Fairly, MD    10/25/2022 7:11 PM

## 2022-10-25 NOTE — Progress Notes (Signed)
Progress Note   Patient: Gregory Gibson:[id number] DOB: 06-25-1975 DOA: 10/24/2022     0 DOS: the patient was seen and examined on 10/25/2022   Brief hospital course: 47 y.o. male with medical history significant of uncontrolled type 2 diabetes, HTN, severe diffuse eczema on Dupixent, hyperlipidemia, who presents to the ED due to hand swelling and pain.   Gregory Gibson states that yesterday he noticed that his hand was beginning to swell and and painful particularly around the thumb.  Then, his symptoms became suddenly, significantly worse overnight.  Today, he has had severe pain with difficulty moving his fingers due to the pain.  He denies any injuries or trauma to the affected hand.  He endorses chills but denies any fever, nausea, vomiting, diarrhea, chest pain, shortness of breath or palpitations.  He notes a history of severe eczema and has been started on Dupixent without significant improvement.  He reports he has difficulty not scratching his eczema rashes.  5/8.  MRI showed tenosynovitis of the right hand and no abscess.  Continue IV antibiotics.  Patient still with quite a bit of swelling and pain.  Assessment and Plan: * Cellulitis of hand Continue IV antibiotics with swelling of the right hand.  Tenosynovitis of finger and hand Continue IV antibiotics.  Orthopedic surgery to see.  Uncontrolled type 2 diabetes mellitus with hyperglycemia, without long-term current use of insulin (HCC) Switch to insulin with A1c being elevated at 11.8.  Continue Semglee insulin 20 units at night and short acting insulin 4 units prior to meals plus sliding scale.  Depression On Zoloft  Eczema Follow-up with dermatology as outpatient.  States he has been off Dupixent for a while since he has not followed up.  Hyperlipidemia Can go back on Lipitor and Tricor  Essential hypertension Continue lisinopril        Subjective: Patient with pain and swelling in his right hand.  Unable to make a  fist at this point.  Has eczema.  Has not been on his Dupixent for a while.  Physical Exam: Vitals:   10/24/22 1907 10/24/22 2310 10/25/22 0840 10/25/22 1549  BP:  (!) 146/98 134/84 (!) 135/92  Pulse:  98 (!) 108 (!) 103  Resp:  18 16 16   Temp:  98.2 F (36.8 C) 98.8 F (37.1 C) 99 F (37.2 C)  TempSrc:      SpO2:  100% 99% 100%  Weight: 83.9 kg     Height: 5\' 11"  (1.803 m)      Physical Exam HENT:     Head: Normocephalic.     Mouth/Throat:     Pharynx: No oropharyngeal exudate.  Eyes:     General: Lids are normal.     Conjunctiva/sclera: Conjunctivae normal.  Cardiovascular:     Rate and Rhythm: Normal rate and regular rhythm.     Heart sounds: Normal heart sounds, S1 normal and S2 normal.  Pulmonary:     Breath sounds: No decreased breath sounds, wheezing, rhonchi or rales.  Abdominal:     Palpations: Abdomen is soft.     Tenderness: There is no abdominal tenderness.  Musculoskeletal:     Right hand: Swelling and tenderness present. Decreased range of motion.  Skin:    General: Skin is warm.     Comments: Darkened discoloration bilateral arms and hands.  Scaling on the skin.  Neurological:     Mental Status: He is alert and oriented to person, place, and time.     Data Reviewed:  White blood cell count 17.6, hemoglobin 11.5, hemoglobin A1c 11.8, sodium 133, creatinine 0.75, uric acid 2.7 MRI right hand showing tenosynovitis with edema on the flexor and extensor tendons.  Disposition: Status is: Inpatient Remains inpatient appropriate because: Continue IV antibiotics.  Planned Discharge Destination: Home    Time spent: 28 minutes  Author: Alford Highland, MD 10/25/2022 4:21 PM  For on call review [email or web address].

## 2022-10-25 NOTE — Plan of Care (Signed)
Focus on carb diet/maintaining blood glucose at an acceptable range. Pain control   Problem: Education: Goal: Ability to describe self-care measures that may prevent or decrease complications (Diabetes Survival Skills Education) will improve Outcome: Progressing Goal: Individualized Educational Video(s) Outcome: Progressing   Problem: Coping: Goal: Ability to adjust to condition or change in health will improve Outcome: Progressing   Problem: Fluid Volume: Goal: Ability to maintain a balanced intake and output will improve Outcome: Progressing   Problem: Health Behavior/Discharge Planning: Goal: Ability to identify and utilize available resources and services will improve Outcome: Progressing Goal: Ability to manage health-related needs will improve Outcome: Progressing   Problem: Metabolic: Goal: Ability to maintain appropriate glucose levels will improve Outcome: Progressing   Problem: Nutritional: Goal: Maintenance of adequate nutrition will improve Outcome: Progressing Goal: Progress toward achieving an optimal weight will improve Outcome: Progressing   Problem: Skin Integrity: Goal: Risk for impaired skin integrity will decrease Outcome: Progressing   Problem: Tissue Perfusion: Goal: Adequacy of tissue perfusion will improve Outcome: Progressing   Problem: Education: Goal: Knowledge of General Education information will improve Description: Including pain rating scale, medication(s)/side effects and non-pharmacologic comfort measures Outcome: Progressing   Problem: Health Behavior/Discharge Planning: Goal: Ability to manage health-related needs will improve Outcome: Progressing   Problem: Clinical Measurements: Goal: Ability to maintain clinical measurements within normal limits will improve Outcome: Progressing Goal: Will remain free from infection Outcome: Progressing Goal: Diagnostic test results will improve Outcome: Progressing Goal: Respiratory  complications will improve Outcome: Progressing Goal: Cardiovascular complication will be avoided Outcome: Progressing   Problem: Activity: Goal: Risk for activity intolerance will decrease Outcome: Progressing   Problem: Nutrition: Goal: Adequate nutrition will be maintained Outcome: Progressing   Problem: Coping: Goal: Level of anxiety will decrease Outcome: Progressing   Problem: Elimination: Goal: Will not experience complications related to bowel motility Outcome: Progressing Goal: Will not experience complications related to urinary retention Outcome: Progressing   Problem: Pain Managment: Goal: General experience of comfort will improve Outcome: Progressing   Problem: Safety: Goal: Ability to remain free from injury will improve Outcome: Progressing   Problem: Skin Integrity: Goal: Risk for impaired skin integrity will decrease Outcome: Progressing

## 2022-10-25 NOTE — Hospital Course (Addendum)
47 y.o. male with medical history significant of uncontrolled type 2 diabetes, HTN, severe diffuse eczema on Dupixent, hyperlipidemia, who presents to the ED due to hand swelling and pain.   Gregory Gibson states that yesterday he noticed that his hand was beginning to swell and and painful particularly around the thumb.  Then, his symptoms became suddenly, significantly worse overnight.  Today, he has had severe pain with difficulty moving his fingers due to the pain.  He denies any injuries or trauma to the affected hand.  He endorses chills but denies any fever, nausea, vomiting, diarrhea, chest pain, shortness of breath or palpitations.  He notes a history of severe eczema and has been started on Dupixent without significant improvement.  He reports he has difficulty not scratching his eczema rashes.  5/8.  MRI showed tenosynovitis of the right hand and no abscess.  Continue IV antibiotics.  Patient still with quite a bit of swelling and pain. 5/9.  Swelling on the hand a little bit less.  Still swollen with his thumb with limited motion but able to make a fist with his other fingers. 5/10.  Patient started having a little drainage of his hand yesterday.  Will give warm soaks and send off culture today.  Continue IV antibiotics. 5/11.  Patient still having drainage from his hand.  To operating room today for incision and drainage. 5/12.  Postoperative day 1 for abscess drainage.  So far nothing growing out of the cultures. 5/13.  Postoperative day 2 for abscess drainage.  Again nothing growing out of the cultures.  Switched over to p.o. doxycycline for 10 days upon going home.  Follow-up with Dr. Martha Clan on Friday.  Keep hand in dressing intact.

## 2022-10-25 NOTE — Assessment & Plan Note (Addendum)
IV antibiotics switched over to oral.

## 2022-10-25 NOTE — Progress Notes (Signed)
Pharmacy Antibiotic Note  Gregory Gibson is a 47 y.o. male admitted on 10/24/2022 with cellulitis and abscess. PMH significant for T2DM, HTN, severe diffuse eczema on Dupixent, HLD. Patient has no documented allergies to antimicrobials. MRI of R hand suggestive of tenosynovitis without fluid collection or abscess. No MRI evidence joint effusion or OM. Pharmacy has been consulted for vancomycin and cefepime dosing.  Plan: Day 1 of antibiotics Continue vancomycin 1500 mg IV Q12H. Goal AUC 400-550. Expected AUC: 471.6 Expected Css min: 11.5 SCr used: 0.8 (actual 0.75)  Weight used: IBW, Vd used: 0.72 (BMI 25.7) Continue cefepime 2 g IV Q8H Patient is also on metronidazole 500 mg IV Q12H Continue to monitor renal function and follow culture results  Height: 5\' 11"  (180.3 cm) Weight: 83.9 kg (185 lb) IBW/kg (Calculated) : 75.3  Temp (24hrs), Avg:98.5 F (36.9 C), Min:98.2 F (36.8 C), Max:98.8 F (37.1 C)  Recent Labs  Lab 10/24/22 1912 10/24/22 2019 10/25/22 0534  WBC 16.0*  --  17.6*  CREATININE 0.89  --  0.75  LATICACIDVEN  --  1.4  1.5  --      Estimated Creatinine Clearance: 121.6 mL/min (by C-G formula based on SCr of 0.75 mg/dL).    Allergies  Allergen Reactions   Gadavist [Gadobutrol] Nausea And Vomiting    Patient vomits immediately  after injection   Antimicrobials this admission: 5/7 Ceftriaxone x1 5/7 Vancomycin  >>  5/8 Cefepime >>  5/8 Metronidazole   Dose adjustments this admission:  Microbiology results: 5/7 BCx: NG<24H  Thank you for allowing pharmacy to be a part of this patient's care.  Celene Squibb, PharmD PGY1 Pharmacy Resident 10/25/2022 1:21 PM

## 2022-10-26 DIAGNOSIS — M659 Synovitis and tenosynovitis, unspecified: Secondary | ICD-10-CM | POA: Diagnosis not present

## 2022-10-26 DIAGNOSIS — E785 Hyperlipidemia, unspecified: Secondary | ICD-10-CM

## 2022-10-26 DIAGNOSIS — I10 Essential (primary) hypertension: Secondary | ICD-10-CM | POA: Diagnosis not present

## 2022-10-26 DIAGNOSIS — L03119 Cellulitis of unspecified part of limb: Secondary | ICD-10-CM | POA: Diagnosis not present

## 2022-10-26 DIAGNOSIS — E1165 Type 2 diabetes mellitus with hyperglycemia: Secondary | ICD-10-CM | POA: Diagnosis not present

## 2022-10-26 LAB — CBC
HCT: 33.7 % — ABNORMAL LOW (ref 39.0–52.0)
Hemoglobin: 11.2 g/dL — ABNORMAL LOW (ref 13.0–17.0)
MCH: 21.9 pg — ABNORMAL LOW (ref 26.0–34.0)
MCHC: 33.2 g/dL (ref 30.0–36.0)
MCV: 65.9 fL — ABNORMAL LOW (ref 80.0–100.0)
Platelets: 295 10*3/uL (ref 150–400)
RBC: 5.11 MIL/uL (ref 4.22–5.81)
RDW: 13.5 % (ref 11.5–15.5)
WBC: 15.1 10*3/uL — ABNORMAL HIGH (ref 4.0–10.5)
nRBC: 0 % (ref 0.0–0.2)

## 2022-10-26 LAB — GLUCOSE, CAPILLARY
Glucose-Capillary: 284 mg/dL — ABNORMAL HIGH (ref 70–99)
Glucose-Capillary: 334 mg/dL — ABNORMAL HIGH (ref 70–99)
Glucose-Capillary: 311 mg/dL — ABNORMAL HIGH (ref 70–99)
Glucose-Capillary: 302 mg/dL — ABNORMAL HIGH (ref 70–99)

## 2022-10-26 LAB — CULTURE, BLOOD (ROUTINE X 2): Special Requests: ADEQUATE

## 2022-10-26 MED ORDER — INSULIN ASPART 100 UNIT/ML IJ SOLN
6.0000 [IU] | Freq: Three times a day (TID) | INTRAMUSCULAR | Status: DC
  Administered 2022-10-26: 6 [IU] via SUBCUTANEOUS
  Filled 2022-10-26: qty 1

## 2022-10-26 MED ORDER — INSULIN GLARGINE-YFGN 100 UNIT/ML ~~LOC~~ SOLN
25.0000 [IU] | Freq: Every day | SUBCUTANEOUS | Status: DC
  Administered 2022-10-26 – 2022-10-27 (×2): 25 [IU] via SUBCUTANEOUS
  Filled 2022-10-26 (×3): qty 0.25

## 2022-10-26 NOTE — Plan of Care (Signed)
  Problem: Education: Goal: Ability to describe self-care measures that may prevent or decrease complications (Diabetes Survival Skills Education) will improve Outcome: Progressing   Problem: Skin Integrity: Goal: Risk for impaired skin integrity will decrease Outcome: Progressing   Problem: Pain Managment: Goal: General experience of comfort will improve Outcome: Progressing   Problem: Safety: Goal: Ability to remain free from injury will improve Outcome: Progressing   Problem: Skin Integrity: Goal: Risk for impaired skin integrity will decrease Outcome: Progressing   

## 2022-10-26 NOTE — Inpatient Diabetes Management (Addendum)
Inpatient Diabetes Program Recommendations  AACE/ADA: New Consensus Statement on Inpatient Glycemic Control (2015)  Target Ranges:  Prepandial:   less than 140 mg/dL      Peak postprandial:   less than 180 mg/dL (1-2 hours)      Critically ill patients:  140 - 180 mg/dL    Latest Reference Range & Units 10/24/22 19:11  Hemoglobin A1C 4.8 - 5.6 % 11.8 (H)  (H): Data is abnormally high  Latest Reference Range & Units 10/25/22 08:08 10/25/22 11:40 10/25/22 16:27 10/25/22 21:16  Glucose-Capillary 70 - 99 mg/dL 161 (H)  7 units Novolog  155 (H)  8 units Novolog  270 (H)  15 units Novolog  234 (H)    20 units Semglee  (H): Data is abnormally high  Latest Reference Range & Units 10/26/22 07:44  Glucose-Capillary 70 - 99 mg/dL 096 (H)  15 units Novolog   (H): Data is abnormally high   Admit: Right hand cellulitis   History: DM2  Home DM Meds: Glipizide 10 mg BID      Metformin 1000 mg BID      Jardiance 25 mg daily   Current Orders: Semglee 20 units QHS     Novolog Resistant Correction Scale/ SSI (0-20 units) TID AC      Novolog 4 units TID with meals    MD- Note CBG 284 this AM.  Please consider:  1. Increase Semglee to 25 units QHS (0.3 units/kg)  2. Increase Novolog Meal Coverage to 6 units TID with meals  Do you anticipate pt will need insulin for home??   Addendum 1pm--Met w/ pt art bedside to discuss going home on insulin and use of the Dexcom G7 for home.  Pt open and willing to use insulin at home if needed.  Explained Basal/Bolus (long-acting and quick-acting) insulin regimen to pt.  Explained how each work, when to take, how to take, etc.  Discussed with pt that the MD will prescribe set amount of Long acting and he may be given either a set amount of the quick acting to take with meals or that he may be given a SSI to follow--either way pt will not need to guess at any doses of Insulin.  Educated patient on insulin pen use at home.  Reviewed all steps of  insulin pen including attachment of needle, 2-unit air shot, dialing up dose, giving injection, rotation of injection sites, removing needle, disposal of sharps, storage of unused insulin, disposal of insulin etc.  Patient able to provide successful return demonstration.  Reviewed troubleshooting with insulin pen.  Also reviewed Signs/Symptoms of Hypoglycemia with patient and how to treat Hypoglycemia at home.  Have asked RNs caring for patient to please allow patient to give all injections here in hospital as much as possible for practice.  MD to give patient Rxs for insulin pens and insulin pen needles.    Also reviewed Dexcom CGM for home use.  Assisted pt to begin the download of the Dexcom app and asked pt to go ahead and create an account.  Did not start Dexcom sensor yet as pt not ready to go home yet.  Reviewed sensor life, difference between sensor reading and fingerstick CBG, and how the app works.  Asked pt to always check fingerstick CBG when CGM reading does not match how her feels.  Pt educated to get refills for the Dexcom from PCP at the [place] clinic.        --Will follow patient during hospitalization--  Ambrose Finland RN, MSN, CDCES Diabetes Coordinator Inpatient Glycemic Control Team Team Pager: [phone] (8a-5p)

## 2022-10-26 NOTE — Progress Notes (Signed)
Subjective:  Patient states that he is right-hand pain and swelling continue to improve.  Objective:   VITALS:   Vitals:   10/25/22 1549 10/26/22 0026 10/26/22 0037 10/26/22 0748  BP: (!) 135/92 (!) 163/88 138/89 128/78  Pulse: (!) 103 (!) 105 (!) 104 86  Resp: 16 16  16   Temp: 99 F (37.2 C) 98.5 F (36.9 C)  98.6 F (37 C)  TempSrc:      SpO2: 100% 100% 98% 100%  Weight:      Height:        PHYSICAL EXAM: Right hand: Patient swelling in the right thumb index finger and webbing between the digits is improving.  He still has swelling in the webbing without palpable fluctuance or abscess.  Patient can now oppose his thumb to the other fingers which she was unable to do yesterday.  Fingers remained well-perfused.  He has intact sensation light touch in all 5 digits.  Patient can actively flex and extend all 5 digits and the motion has increased since yesterday.  The swelling has not extended into the middle ring or small finger and has not extended into the forearm.  His forearm compartments are soft and compressible.  Patient has diffuse eczema involving the right forearm.   LABS  Results for orders placed or performed during the hospital encounter of 10/24/22 (from the past 24 hour(s))  Culture, blood (Routine X 2) w Reflex to ID Panel     Status: None (Preliminary result)   Collection Time: 10/25/22  2:00 PM   Specimen: BLOOD  Result Value Ref Range   Specimen Description BLOOD LEFT ANTECUBITAL    Special Requests      BOTTLES DRAWN AEROBIC AND ANAEROBIC Blood Culture results may not be optimal due to an excessive volume of blood received in culture bottles   Culture      NO GROWTH < 24 HOURS Performed at [facility], [address] [place], [place] [address]    Report Status PENDING   Glucose, capillary     Status: Abnormal   Collection Time: 10/25/22  4:27 PM  Result Value Ref Range   Glucose-Capillary 270 (H) 70 - 99 mg/dL  Glucose, capillary     Status:  Abnormal   Collection Time: 10/25/22  9:16 PM  Result Value Ref Range   Glucose-Capillary 234 (H) 70 - 99 mg/dL  CBC     Status: Abnormal   Collection Time: 10/26/22  4:28 AM  Result Value Ref Range   WBC 15.1 (H) 4.0 - 10.5 K/uL   RBC 5.11 4.22 - 5.81 MIL/uL   Hemoglobin 11.2 (L) 13.0 - 17.0 g/dL   HCT 29.5 (L) 62.1 - 30.8 %   MCV 65.9 (L) 80.0 - 100.0 fL   MCH 21.9 (L) 26.0 - 34.0 pg   MCHC 33.2 30.0 - 36.0 g/dL   RDW 65.7 84.6 - 96.2 %   Platelets 295 150 - 400 K/uL   nRBC 0.0 0.0 - 0.2 %  Glucose, capillary     Status: Abnormal   Collection Time: 10/26/22  7:44 AM  Result Value Ref Range   Glucose-Capillary 284 (H) 70 - 99 mg/dL  Glucose, capillary     Status: Abnormal   Collection Time: 10/26/22 11:37 AM  Result Value Ref Range   Glucose-Capillary 334 (H) 70 - 99 mg/dL    MR HAND RIGHT W WO CONTRAST  Result Date: 10/25/2022 CLINICAL DATA:  Soft tissue infection suspected. Uncontrolled type 2 diabetes. EXAM:  MRI OF THE RIGHT HAND WITHOUT AND WITH CONTRAST TECHNIQUE: Multiplanar, multisequence MR imaging of the right hand was performed before and after the administration of intravenous contrast. CONTRAST:  8mL GADAVIST GADOBUTROL 1 MMOL/ML IV SOLN COMPARISON:  Radiographs dated Oct 24, 2022 FINDINGS: Bones/Joint/Cartilage Marrow signal is within normal limits. No evidence of fracture or dislocation. No evidence of osteomyelitis. No appreciable joint effusion. Ligaments Collateral ligaments are intact. Muscles and Tendons Edema along the flexor and extensor tendons. No fluid collection or abscess. Mild edema of the thenar and hypothenar muscles. Soft tissues Heterogeneous soft tissue enhancement about the first web space suspicious for infectious/inflammatory process. No definite abscess or fluid collection. Generalized soft tissue edema about the dorsum of the hand. IMPRESSION: 1. Heterogeneous soft tissue enhancement about the first web space suspicious for infectious/inflammatory  process. No definite abscess or fluid collection. 2. Edema along the flexor and extensor tendons suggesting tenosynovitis without fluid collection or abscess. 3. Mild edema of the thenar and hypothenar muscles. No intramuscular fluid collection or abscess. 4. No evidence of osteomyelitis or joint effusion. Electronically Signed   By: Larose Hires D.O.   On: 10/25/2022 09:20   DG Hand Complete Right  Result Date: 10/24/2022 CLINICAL DATA:  Hand swelling EXAM: RIGHT HAND - COMPLETE 3 VIEW COMPARISON:  None Available. FINDINGS: No evidence of fracture or dislocation. Moderate degenerative changes of the distal fourth IP joint. Mild degenerative changes of the fifth carpal metacarpal joint. Soft tissues are unremarkable. IMPRESSION: No acute osseous abnormality. Electronically Signed   By: Allegra Lai M.D.   On: 10/24/2022 19:41    Assessment/Plan:     Principal Problem:   Cellulitis of hand Active Problems:   Uncontrolled type 2 diabetes mellitus with hyperglycemia, without long-term current use of insulin (HCC)   Essential hypertension   Hyperlipidemia   Eczema   Tenosynovitis of finger and hand   Depression  Continue IV antibiotics as ordered.  White count is decreased to 15.1 today.  No plan for surgery at this time.  Patient is clinically improving on IV antibiotics.  I will continue to follow with primary team.  Continue diabetes and eczema management/treatment.    Gregory Gibson , MD 10/26/2022, 12:20 PM

## 2022-10-26 NOTE — TOC Initial Note (Signed)
Transition of Care [facility]) - Initial/Assessment Note    Patient Details  Name: Gregory Gibson MRN: [id number] Date of Birth: 09-05-1975  Transition of Care [facility]) CM/SW Contact:    Marlowe Sax, RN Phone Number: 10/26/2022, 10:01 AM  Clinical Narrative:   Transition of Care (TOC) Screening Note   Patient Details  Name: Gregory Gibson Date of Birth: 12/12/1975   Transition of Care [facility]) CM/SW Contact:    Marlowe Sax, RN Phone Number: 10/26/2022, 10:01 AM    Transition of Care Department [facility]) has reviewed patient and no TOC needs have been identified at this time. We will continue to monitor patient advancement through interdisciplinary progression rounds. If new patient transition needs arise, please place a TOC consult.                   Expected Discharge Plan: Home/Self Care Barriers to Discharge: No Barriers Identified, Continued Medical Work up   Patient Goals and CMS Choice            Expected Discharge Plan and Services       Living arrangements for the past 2 months: Single Family Home                                      Prior Living Arrangements/Services Living arrangements for the past 2 months: Single Family Home Lives with:: Spouse                   Activities of Daily Living Home Assistive Devices/Equipment: Eyeglasses ADL Screening (condition at time of admission) Patient's cognitive ability adequate to safely complete daily activities?: Yes Is the patient deaf or have difficulty hearing?: No Does the patient have difficulty seeing, even when wearing glasses/contacts?: No Does the patient have difficulty concentrating, remembering, or making decisions?: No Patient able to express need for assistance with ADLs?: Yes Does the patient have difficulty dressing or bathing?: No Independently performs ADLs?: Yes (appropriate for developmental age) Does the patient have difficulty walking or climbing stairs?: No Weakness of Legs:  None Weakness of Arms/Hands: None  Permission Sought/Granted                  Emotional Assessment              Admission diagnosis:  Cellulitis of hand [L03.119] Cellulitis of right hand [L03.113] Tenosynovitis of finger and hand [M65.9] Patient Active Problem List   Diagnosis Date Noted   Tenosynovitis of finger and hand 10/25/2022   Depression 10/25/2022   Cellulitis of hand 10/24/2022   Uncontrolled type 2 diabetes mellitus with hyperglycemia, without long-term current use of insulin (HCC) 10/24/2022   Essential hypertension 10/24/2022   Hyperlipidemia 10/24/2022   Eczema 10/24/2022   PCP:  [facility] Pharm[facility]:   [facility] - [place], [place] - [address] [place] [address] Phone: [phone] Fax: [phone]  [id number] [place], [place] - [address] [place] [address] Phone: [phone] Fax: [phone]     Social Determinants of Health (SDOH) Social History: SDOH Screenings   Tobacco Use: High Risk (10/24/2022)   SDOH Interventions:     Readmission Risk Interventions     No data to display

## 2022-10-26 NOTE — Progress Notes (Signed)
Progress Note   Patient: Gregory Gibson ZOX:[id number] DOB: Apr 28, 1976 DOA: 10/24/2022     1 DOS: the patient was seen and examined on 10/26/2022   Brief hospital course: 47 y.o. male with medical history significant of uncontrolled type 2 diabetes, HTN, severe diffuse eczema on Dupixent, hyperlipidemia, who presents to the ED due to hand swelling and pain.   Mr. Kindred states that yesterday he noticed that his hand was beginning to swell and and painful particularly around the thumb.  Then, his symptoms became suddenly, significantly worse overnight.  Today, he has had severe pain with difficulty moving his fingers due to the pain.  He denies any injuries or trauma to the affected hand.  He endorses chills but denies any fever, nausea, vomiting, diarrhea, chest pain, shortness of breath or palpitations.  He notes a history of severe eczema and has been started on Dupixent without significant improvement.  He reports he has difficulty not scratching his eczema rashes.  5/8.  MRI showed tenosynovitis of the right hand and no abscess.  Continue IV antibiotics.  Patient still with quite a bit of swelling and pain. 5/9.  Swelling on the hand a little bit less.  Still swollen with his thumb with limited motion but able to make a fist with his other fingers.  Assessment and Plan: * Cellulitis of hand Continue IV antibiotics with swelling of the right hand and thumb.  Tenosynovitis of finger and hand Continue IV antibiotics.    Uncontrolled type 2 diabetes mellitus with hyperglycemia, without long-term current use of insulin (HCC) Switch to insulin with A1c being elevated at 11.8.  Increase Semglee insulin 25 units at night and short acting insulin 6 units prior to meals plus sliding scale.  Depression On Zoloft  Eczema Follow-up with dermatology as outpatient.  States he has been off Dupixent for a while since he has not followed up.  Hyperlipidemia Continue Lipitor and Tricor  Essential  hypertension Continue lisinopril        Subjective: Patient feeling his hand is little bit better swelling little bit down.  Able to bend his fingers better except for his thumb which is still swollen.  Physical Exam: Vitals:   10/25/22 1549 10/26/22 0026 10/26/22 0037 10/26/22 0748  BP: (!) 135/92 (!) 163/88 138/89 128/78  Pulse: (!) 103 (!) 105 (!) 104 86  Resp: 16 16  16   Temp: 99 F (37.2 C) 98.5 F (36.9 C)  98.6 F (37 C)  TempSrc:      SpO2: 100% 100% 98% 100%  Weight:      Height:       Physical Exam HENT:     Head: Normocephalic.     Mouth/Throat:     Pharynx: No oropharyngeal exudate.  Eyes:     General: Lids are normal.     Conjunctiva/sclera: Conjunctivae normal.  Cardiovascular:     Rate and Rhythm: Normal rate and regular rhythm.     Heart sounds: Normal heart sounds, S1 normal and S2 normal.  Pulmonary:     Breath sounds: No decreased breath sounds, wheezing, rhonchi or rales.  Abdominal:     Palpations: Abdomen is soft.     Tenderness: There is no abdominal tenderness.  Musculoskeletal:     Right hand: Swelling and tenderness present. Decreased range of motion.     Comments: Better range of motion fingers.  Still limited range of motion with thumb secondary to swelling.  Skin:    General: Skin is warm.  Comments: Darkened discoloration bilateral arms and hands.  Scaling on the skin.  Neurological:     Mental Status: He is alert and oriented to person, place, and time.     Data Reviewed: Glucose 334, white blood cell count 15.1, hemoglobin 11.2, MCV 65.9   Disposition: Status is: Inpatient Remains inpatient appropriate because: Continue IV antibiotics today.  Swelling a little bit less.  White count still elevated.  Planned Discharge Destination: Home    Time spent: 27 minutes  Author: Alford Highland, MD 10/26/2022 3:06 PM  For on call review [email or web address].

## 2022-10-27 ENCOUNTER — Other Ambulatory Visit: Payer: Self-pay | Admitting: Orthopedic Surgery

## 2022-10-27 ENCOUNTER — Other Ambulatory Visit (HOSPITAL_COMMUNITY): Payer: Self-pay

## 2022-10-27 ENCOUNTER — Telehealth (HOSPITAL_COMMUNITY): Payer: Self-pay | Admitting: Pharmacy Technician

## 2022-10-27 DIAGNOSIS — L03119 Cellulitis of unspecified part of limb: Secondary | ICD-10-CM | POA: Diagnosis not present

## 2022-10-27 DIAGNOSIS — E1165 Type 2 diabetes mellitus with hyperglycemia: Secondary | ICD-10-CM | POA: Diagnosis not present

## 2022-10-27 DIAGNOSIS — M659 Synovitis and tenosynovitis, unspecified: Secondary | ICD-10-CM | POA: Diagnosis not present

## 2022-10-27 DIAGNOSIS — L309 Dermatitis, unspecified: Secondary | ICD-10-CM | POA: Diagnosis not present

## 2022-10-27 LAB — VANCOMYCIN, TROUGH: Vancomycin Tr: 7 ug/mL — ABNORMAL LOW (ref 15–20)

## 2022-10-27 LAB — VANCOMYCIN, PEAK: Vancomycin Pk: 21 ug/mL — ABNORMAL LOW (ref 30–40)

## 2022-10-27 LAB — GLUCOSE, CAPILLARY
Glucose-Capillary: 366 mg/dL — ABNORMAL HIGH (ref 70–99)
Glucose-Capillary: 116 mg/dL — ABNORMAL HIGH (ref 70–99)
Glucose-Capillary: 481 mg/dL — ABNORMAL HIGH (ref 70–99)
Glucose-Capillary: 357 mg/dL — ABNORMAL HIGH (ref 70–99)

## 2022-10-27 LAB — CREATININE, SERUM
Creatinine, Ser: 0.85 mg/dL (ref 0.61–1.24)
GFR, Estimated: >60 mL/min (ref >60)

## 2022-10-27 LAB — CULTURE, BLOOD (ROUTINE X 2)

## 2022-10-27 MED ORDER — INSULIN ASPART 100 UNIT/ML IJ SOLN
12.0000 [IU] | Freq: Three times a day (TID) | INTRAMUSCULAR | Status: DC
  Administered 2022-10-27 (×3): 12 [IU] via SUBCUTANEOUS
  Filled 2022-10-27 (×3): qty 1

## 2022-10-27 MED ORDER — INSULIN ASPART 100 UNIT/ML IJ SOLN
0.0000 [IU] | Freq: Every day | INTRAMUSCULAR | Status: DC
  Administered 2022-10-27 – 2022-10-29 (×2): 5 [IU] via SUBCUTANEOUS
  Filled 2022-10-27 (×2): qty 1

## 2022-10-27 MED ORDER — CEFAZOLIN SODIUM-DEXTROSE 2-4 GM/100ML-% IV SOLN
2.0000 g | INTRAVENOUS | Status: AC
  Administered 2022-10-28: 2 g via INTRAVENOUS
  Filled 2022-10-27: qty 100

## 2022-10-27 MED ORDER — INSULIN ASPART 100 UNIT/ML IJ SOLN
0.0000 [IU] | Freq: Three times a day (TID) | INTRAMUSCULAR | Status: DC
  Administered 2022-10-27 – 2022-10-28 (×4): 20 [IU] via SUBCUTANEOUS
  Administered 2022-10-29: 7 [IU] via SUBCUTANEOUS
  Administered 2022-10-29: 4 [IU] via SUBCUTANEOUS
  Administered 2022-10-29 – 2022-10-30 (×3): 7 [IU] via SUBCUTANEOUS
  Filled 2022-10-27 (×9): qty 1

## 2022-10-27 MED ORDER — VANCOMYCIN HCL 2000 MG/400ML IV SOLN
2000.0000 mg | Freq: Two times a day (BID) | INTRAVENOUS | Status: DC
  Administered 2022-10-27 – 2022-10-30 (×5): 2000 mg via INTRAVENOUS
  Filled 2022-10-27 (×5): qty 400

## 2022-10-27 NOTE — Telephone Encounter (Signed)
Patient Advocate Encounter   Received notification that prior authorization for Dexcom G7 Sensor is required.   PA submitted on 10/27/2022 Key [id number] [employer] Electronic PA Form Status is pending       Roland Earl, CPhT Pharmacy Patient Advocate Specialist [facility] Patient Advocate Team Direct Number: [phone]  Fax: [phone]

## 2022-10-27 NOTE — Plan of Care (Signed)

## 2022-10-27 NOTE — Progress Notes (Signed)
Subjective:  Patient was in the restroom during my rounds earlier today.  Patient was seen this afternoon and states that he has noted drainage from the webspace between his right thumb and index finger.  He states that cultures were taken of the drainage.  A bandage is on his hand.  Objective:   VITALS:   Vitals:   10/26/22 1732 10/26/22 2337 10/27/22 0802 10/27/22 1545  BP: (!) 134/92 (!) 148/99 106/81 131/85  Pulse: 93 (!) 106 79 88  Resp: 18 18 16 16   Temp: 98.3 F (36.8 C) 97.7 F (36.5 C) 98.2 F (36.8 C) 97.9 F (36.6 C)  TempSrc:      SpO2: 100% 99% 100% 99%  Weight:      Height:        PHYSICAL EXAM: Right hand:  A small amount of cloudy fluid to be expressed from a small opening in the webbing between the thumb and index finger near the thumb.  Patient can move his index through small fingers without pain.  He has mild to moderate pain with thumb motion.  His digits are all well-perfused.  He has intact sensation light touch in all digits.  There is no involvement of the patient's forearm.  His compartments of the forearm are soft and compressible.  LABS  Results for orders placed or performed during the hospital encounter of 10/24/22 (from the past 24 hour(s))  Glucose, capillary     Status: Abnormal   Collection Time: 10/26/22  5:00 PM  Result Value Ref Range   Glucose-Capillary 311 (H) 70 - 99 mg/dL  Glucose, capillary     Status: Abnormal   Collection Time: 10/26/22  8:44 PM  Result Value Ref Range   Glucose-Capillary 302 (H) 70 - 99 mg/dL  Creatinine, serum     Status: None   Collection Time: 10/27/22  3:36 AM  Result Value Ref Range   Creatinine, Ser 0.85 0.61 - 1.24 mg/dL   GFR, Estimated >95 >62 mL/min  Glucose, capillary     Status: Abnormal   Collection Time: 10/27/22  7:36 AM  Result Value Ref Range   Glucose-Capillary 366 (H) 70 - 99 mg/dL  Vancomycin, trough     Status: Abnormal   Collection Time: 10/27/22 10:07 AM  Result Value Ref Range    Vancomycin Tr 7 (L) 15 - 20 ug/mL  Glucose, capillary     Status: Abnormal   Collection Time: 10/27/22 11:37 AM  Result Value Ref Range   Glucose-Capillary 116 (H) 70 - 99 mg/dL  Vancomycin, peak     Status: Abnormal   Collection Time: 10/27/22  2:06 PM  Result Value Ref Range   Vancomycin Pk 21 (L) 30 - 40 ug/mL    No results found.  Assessment/Plan:     Principal Problem:   Cellulitis of hand Active Problems:   Uncontrolled type 2 diabetes mellitus with hyperglycemia, without long-term current use of insulin (HCC)   Essential hypertension   Hyperlipidemia   Eczema   Tenosynovitis of finger and hand   Depression  Patient now with drainage from the webbing between his thumb and index finger.  At this point I would recommend an incision and drainage of appears to be a developing abscess.  Patient will be n.p.o. after midnight.  I will stop his Lovenox in preparation for the case.  I will reexamine his hand in the morning.  If he is having persistent drainage patient will undergo an I&D as planned.  The patient  is on the OR schedule tentatively for tomorrow at 10 AM.  He will continue his IV antibiotics as ordered.  Patient understood and agreed with this plan.    Juanell Fairly , MD 10/27/2022, 4:04 PM

## 2022-10-27 NOTE — Plan of Care (Signed)
  Problem: Education: Goal: Ability to describe self-care measures that may prevent or decrease complications (Diabetes Survival Skills Education) will improve Outcome: Progressing   Problem: Skin Integrity: Goal: Risk for impaired skin integrity will decrease Outcome: Progressing   Problem: Pain Managment: Goal: General experience of comfort will improve Outcome: Progressing   Problem: Safety: Goal: Ability to remain free from injury will improve Outcome: Progressing   

## 2022-10-27 NOTE — Telephone Encounter (Signed)
Patient Advocate Encounter  Prior Authorization for [employer]  has been approved.    PA# [id number] [employer] Electronic PA Form  Effective dates: 10/27/2022 through 10/27/2023  Patients co-pay is $30.00.     Roland Earl, CPhT Pharmacy Patient Advocate Specialist [facility] Patient Advocate Team Direct Number: [phone]  Fax: [phone]

## 2022-10-27 NOTE — TOC Benefit Eligibility Note (Signed)
Patient Product/process development scientist completed.    The patient is currently admitted and upon discharge could be taking Semglee Pen.  The current 30 day co-pay is $25.00.   The patient is currently admitted and upon discharge could be taking Humalog KwikPen.  The current 30 day co-pay is $25.00.   The patient is insured through [employer]   This test claim was processed through [employer]- copay amounts may vary at other pharmacies due to [employer], or as the patient moves through the different stages of their insurance plan.  Roland Earl, CPHT Pharmacy Patient Advocate Specialist [facility] Pharm[facility] Patient Advocate Team Direct Number: [phone]  Fax: [phone]

## 2022-10-27 NOTE — Progress Notes (Signed)
Pharmacy Antibiotic Note  Gregory Gibson is a 47 y.o. male admitted on 10/24/2022 with cellulitis and abscess. PMH significant for T2DM, HTN, severe diffuse eczema on Dupixent, HLD. Patient has no documented allergies to antimicrobials. MRI of R hand suggestive of tenosynovitis without fluid collection or abscess. No MRI evidence joint effusion or OM. Pharmacy has been consulted for vancomycin and cefepime dosing.  Assessment: Current regimen: Vancomycin 1500 mg IV Q12H Vanc tr 5/10 @ 1007: 7 ug/mL Vanc pk 5/10 @ 1406: 21 ug/mL PK Calculations AUC: 362.1 Cmin: 8.1 T 1/2: 6.3hr   Plan: Day 3 of antibiotics Change vancomycin dose to 2000 mg IV Q12H. Goal AUC 400-550. Expected AUC: 481.2 Expected Css min: 10.8 Continue cefepime 2 g IV Q8H Continue to monitor renal function and follow culture results  Height: 5\' 11"  (180.3 cm) Weight: 83.9 kg (185 lb) IBW/kg (Calculated) : 75.3  Temp (24hrs), Avg:98.1 F (36.7 C), Min:97.7 F (36.5 C), Max:98.3 F (36.8 C)  Recent Labs  Lab 10/24/22 1912 10/24/22 2019 10/25/22 0534 10/26/22 0428 10/27/22 0336  WBC 16.0*  --  17.6* 15.1*  --   CREATININE 0.89  --  0.75  --  0.85  LATICACIDVEN  --  1.4  1.5  --   --   --      Estimated Creatinine Clearance: 114.4 mL/min (by C-G formula based on SCr of 0.85 mg/dL).    Allergies  Allergen Reactions   Gadavist [Gadobutrol] Nausea And Vomiting    Patient vomits immediately  after injection   Antimicrobials this admission: 5/7 Ceftriaxone x1 5/7 Vancomycin  >>  5/8 Cefepime >>  5/8 Metronidazole   Dose adjustments this admission: 5/10 Vancomycin 1500 mg IV Q12H >> Vancomycin 2000 mg IV Q12H   Microbiology results: 5/7 BCx: NG2D 5/10 Wound Cx: NG  Thank you for allowing pharmacy to be a part of this patient's care.  Celene Squibb, PharmD PGY1 Pharmacy Resident 10/27/2022 9:25 AM

## 2022-10-27 NOTE — Telephone Encounter (Signed)
Pharmacy Patient Advocate Encounter  Insurance verification completed.    The patient is insured through [employer]   The patient is currently admitted and ran test claims for the following: Semglee, Humalog.  Copays and coinsurance results were relayed to Inpatient clinical team.

## 2022-10-27 NOTE — Progress Notes (Signed)
Progress Note   Patient: Gregory Gibson:[id number] DOB: 04-23-1976 DOA: 10/24/2022     2 DOS: the patient was seen and examined on 10/27/2022   Brief hospital course: 47 y.o. male with medical history significant of uncontrolled type 2 diabetes, HTN, severe diffuse eczema on Dupixent, hyperlipidemia, who presents to the ED due to hand swelling and pain.   Gregory Gibson states that yesterday he noticed that his hand was beginning to swell and and painful particularly around the thumb.  Then, his symptoms became suddenly, significantly worse overnight.  Today, he has had severe pain with difficulty moving his fingers due to the pain.  He denies any injuries or trauma to the affected hand.  He endorses chills but denies any fever, nausea, vomiting, diarrhea, chest pain, shortness of breath or palpitations.  He notes a history of severe eczema and has been started on Dupixent without significant improvement.  He reports he has difficulty not scratching his eczema rashes.  5/8.  MRI showed tenosynovitis of the right hand and no abscess.  Continue IV antibiotics.  Patient still with quite a bit of swelling and pain. 5/9.  Swelling on the hand a little bit less.  Still swollen with his thumb with limited motion but able to make a fist with his other fingers. 5/10.  Patient started having a little drainage of his hand yesterday.  Will give warm soaks and send off culture today.  Continue IV antibiotics.  Assessment and Plan: * Cellulitis of hand Continue IV antibiotics with swelling of the right hand and thumb.  Patient had some drainage from his hand.  Will give warm soaks to facilitate drainage.  Send off culture.  Tenosynovitis of finger and hand Continue IV antibiotics.    Uncontrolled type 2 diabetes mellitus with hyperglycemia, without long-term current use of insulin (HCC) Switch to insulin with A1c being elevated at 11.8.  Increase Semglee insulin 25 units at night and short acting insulin 12  units prior to meals plus sliding scale.  Depression On Zoloft  Eczema Follow-up with dermatology as outpatient.  States he has been off Dupixent for a while since he has not followed up.  Hyperlipidemia Continue Lipitor and Tricor  Essential hypertension Blood pressure on the lower side will hold lisinopril for now.        Subjective: Patient started having drainage from his hand yesterday.  Started warm soaks today to help out with further drainage.  Culture sent off.  Swelling better on his hand.  Still swollen on his thumb.  Physical Exam: Vitals:   10/26/22 0748 10/26/22 1732 10/26/22 2337 10/27/22 0802  BP: 128/78 (!) 134/92 (!) 148/99 106/81  Pulse: 86 93 (!) 106 79  Resp: 16 18 18 16   Temp: 98.6 F (37 C) 98.3 F (36.8 C) 97.7 F (36.5 C) 98.2 F (36.8 C)  TempSrc:      SpO2: 100% 100% 99% 100%  Weight:      Height:       Physical Exam HENT:     Head: Normocephalic.     Mouth/Throat:     Pharynx: No oropharyngeal exudate.  Eyes:     General: Lids are normal.     Conjunctiva/sclera: Conjunctivae normal.  Cardiovascular:     Rate and Rhythm: Normal rate and regular rhythm.     Heart sounds: Normal heart sounds, S1 normal and S2 normal.  Pulmonary:     Breath sounds: No decreased breath sounds, wheezing, rhonchi or rales.  Abdominal:  Palpations: Abdomen is soft.     Tenderness: There is no abdominal tenderness.  Musculoskeletal:     Right hand: Swelling and tenderness present. Decreased range of motion.     Comments: Better range of motion fingers.  Still limited range of motion with thumb secondary to swelling.  Skin:    General: Skin is warm.     Comments: Darkened discoloration bilateral arms and hands.  Scaling on the skin.  Neurological:     Mental Status: He is alert and oriented to person, place, and time.     Data Reviewed: No new data today   Disposition: Status is: Inpatient Remains inpatient appropriate because: Started to have  drainage of his hand today.  Will give warm soaks.  Planned Discharge Destination: Home    Time spent: 28 minutes  Author: Alford Highland, MD 10/27/2022 3:13 PM  For on call review [email or web address].

## 2022-10-27 NOTE — Inpatient Diabetes Management (Addendum)
Inpatient Diabetes Program Recommendations  AACE/ADA: New Consensus Statement on Inpatient Glycemic Control  Target Ranges:  Prepandial:   less than 140 mg/dL      Peak postprandial:   less than 180 mg/dL (1-2 hours)      Critically ill patients:  140 - 180 mg/dL    Latest Reference Range & Units 10/26/22 07:44 10/26/22 11:37 10/26/22 17:00 10/26/22 20:44  Glucose-Capillary 70 - 99 mg/dL 161 (H)  Novolog 15 units 334 (H)  Novolog 19 units 311 (H)  Novolog 21 units 302 (H)     Semglee 25 units   Review of Glycemic Control   Diabetes history: DM2 Outpatient Diabetes medications: Glipizide 10 mg BID, Metformin 1000 mg BID, Jardiance 25 mg daily Current orders for Inpatient glycemic control: Semglee 25 units QHS, Novolog 0-20 units TID with meals, Novolog 6 units TID with meals  Inpatient Diabetes Program Recommendations:    Insulin: Please consider changing frequency of Novolog 0-20 units to AC&HS (to include bedtime) and increase meal coverage to Novolog 12 units TID with meals.  Discharge Recommendations: Other recommendations: Dexcom G7 sensors [phone]) Long acting recommendations: Insulin Glargine-yfgn (SEMGLEE) Pen   Short acting recommendations:  Meal + Correction coverage Insulin lispro (HUMALOG) KwikPen   Supply/Referral recommendations: Pen needles - standard Referral to Nutrition & Diab Services  Addendum 10/27/22@12 :25-Spoke with patient at bedside about diabetes, insulin, and using Dexcom G7 CGM. Patient reports he has given his mother insulin with vial/syringe and insulin pens in the past so he is familiar with using insulin. Educated patient on insulin pen use at home. Reviewed all steps of insulin pen including attachment of needle, 2-unit air shot, dialing up dose, giving injection, removing needle, disposal of sharps, storage of unused insulin, disposal of insulin etc. Patient able to provide successful return demonstration. Patient was given Dexcom G7 sensor  on 5/9 and plans to start using once discharged home. Had patient watch youtube video on how to apply Dexcom G7. Discussed Dexcom G7 CGM regarding application and changing CGM sensor, 30 minute warm-up, how to start a new sensor, and how to use app to check glucose.  Patient plans to use Dexcom G7 phone app to read Dexcom G7 sensor. Informed patient that it would be requested that attending provider provide Rx for first month of Dexcom G7 sensors and that he have PCP continue to provide Rx for Dexcom G7 sensors going forward. Asked patient to be sure to let PCP know about Dexcom G7 and allow provider to review reports from app so the provider can use the information to continue to make adjustments with DM medications if needed.  Asked patient to be sure to call and set up appointment in the next week with PCP. Patient verbalized understanding of information discussed and reports no further questions at this time.   Thanks, Orlando Penner, RN, MSN, CDCES Diabetes Coordinator Inpatient Diabetes Program [phone] (Team Pager from 8am to 5pm)

## 2022-10-28 ENCOUNTER — Inpatient Hospital Stay: Payer: Managed Care, Other (non HMO) | Admitting: Certified Registered"

## 2022-10-28 ENCOUNTER — Other Ambulatory Visit: Payer: Self-pay

## 2022-10-28 ENCOUNTER — Encounter
Admission: EM | Disposition: A | Discharge status: Home / Self Care | Payer: Self-pay | Source: Home / Self Care | Attending: Internal Medicine

## 2022-10-28 DIAGNOSIS — E7849 Other hyperlipidemia: Secondary | ICD-10-CM

## 2022-10-28 DIAGNOSIS — M659 Synovitis and tenosynovitis, unspecified: Secondary | ICD-10-CM | POA: Diagnosis not present

## 2022-10-28 DIAGNOSIS — L03119 Cellulitis of unspecified part of limb: Secondary | ICD-10-CM | POA: Diagnosis not present

## 2022-10-28 DIAGNOSIS — E1165 Type 2 diabetes mellitus with hyperglycemia: Secondary | ICD-10-CM | POA: Diagnosis not present

## 2022-10-28 DIAGNOSIS — L309 Dermatitis, unspecified: Secondary | ICD-10-CM | POA: Diagnosis not present

## 2022-10-28 HISTORY — PX: I & D EXTREMITY: SHX5045

## 2022-10-28 LAB — CBC
HCT: 32.6 % — ABNORMAL LOW (ref 39.0–52.0)
Hemoglobin: 11.1 g/dL — ABNORMAL LOW (ref 13.0–17.0)
MCH: 22.2 pg — ABNORMAL LOW (ref 26.0–34.0)
MCHC: 34 g/dL (ref 30.0–36.0)
MCV: 65.1 fL — ABNORMAL LOW (ref 80.0–100.0)
Platelets: 344 10*3/uL (ref 150–400)
RBC: 5.01 MIL/uL (ref 4.22–5.81)
RDW: 13.6 % (ref 11.5–15.5)
WBC: 10.2 10*3/uL (ref 4.0–10.5)
nRBC: 0 % (ref 0.0–0.2)

## 2022-10-28 LAB — BASIC METABOLIC PANEL
Anion gap: 6 (ref 5–15)
BUN: 19 mg/dL (ref 6–20)
CO2: 26 mmol/L (ref 22–32)
Calcium: 8.8 mg/dL — ABNORMAL LOW (ref 8.9–10.3)
Chloride: 99 mmol/L (ref 98–111)
Creatinine, Ser: 0.76 mg/dL (ref 0.61–1.24)
GFR, Estimated: >60 mL/min (ref >60)
Glucose, Bld: 429 mg/dL — ABNORMAL HIGH (ref 70–99)
Potassium: 4.3 mmol/L (ref 3.5–5.1)
Sodium: 131 mmol/L — ABNORMAL LOW (ref 135–145)

## 2022-10-28 LAB — CULTURE, BLOOD (ROUTINE X 2): Culture: NO GROWTH

## 2022-10-28 LAB — GLUCOSE, CAPILLARY
Glucose-Capillary: 366 mg/dL — ABNORMAL HIGH (ref 70–99)
Glucose-Capillary: 115 mg/dL — ABNORMAL HIGH (ref 70–99)
Glucose-Capillary: 549 mg/dL (ref 70–99)
Glucose-Capillary: 511 mg/dL (ref 70–99)
Glucose-Capillary: 439 mg/dL — ABNORMAL HIGH (ref 70–99)

## 2022-10-28 LAB — AEROBIC CULTURE W GRAM STAIN (SUPERFICIAL SPECIMEN): Culture: NO GROWTH

## 2022-10-28 SURGERY — IRRIGATION AND DEBRIDEMENT EXTREMITY
Anesthesia: General | Site: Hand | Laterality: Right

## 2022-10-28 MED ORDER — OXYCODONE HCL 5 MG/5ML PO SOLN: 5.0000 mg | Freq: Once | ORAL | Status: AC | PRN

## 2022-10-28 MED ORDER — ACETAMINOPHEN 10 MG/ML IV SOLN: 1000.0000 mg | Freq: Once | INTRAVENOUS | Status: DC | PRN

## 2022-10-28 MED ORDER — SODIUM CHLORIDE 0.9 % IR SOLN: Status: DC | PRN

## 2022-10-28 MED ORDER — FENTANYL CITRATE (PF) 100 MCG/2ML IJ SOLN
INTRAMUSCULAR | Status: AC
  Filled 2022-10-28: qty 2

## 2022-10-28 MED ORDER — CELECOXIB 200 MG PO CAPS
200.0000 mg | ORAL_CAPSULE | Freq: Once | ORAL | Status: AC
  Administered 2022-10-28: 200 mg via ORAL

## 2022-10-28 MED ORDER — OXYCODONE HCL 5 MG PO TABS
5.0000 mg | ORAL_TABLET | Freq: Once | ORAL | Status: AC | PRN
  Administered 2022-10-28: 5 mg via ORAL

## 2022-10-28 MED ORDER — PROPOFOL 10 MG/ML IV BOLUS
INTRAVENOUS | Status: DC | PRN
  Administered 2022-10-28: 30 mg via INTRAVENOUS

## 2022-10-28 MED ORDER — PROPOFOL 500 MG/50ML IV EMUL
INTRAVENOUS | Status: DC | PRN
  Administered 2022-10-28: 75 ug/kg/min via INTRAVENOUS

## 2022-10-28 MED ORDER — INSULIN ASPART 100 UNIT/ML IJ SOLN
12.0000 [IU] | Freq: Once | INTRAMUSCULAR | Status: AC
  Administered 2022-10-28: 12 [IU] via SUBCUTANEOUS
  Filled 2022-10-28: qty 1

## 2022-10-28 MED ORDER — DEXMEDETOMIDINE HCL IN NACL 80 MCG/20ML IV SOLN
INTRAVENOUS | Status: DC | PRN
  Administered 2022-10-28: 4 ug via INTRAVENOUS
  Administered 2022-10-28 (×2): 8 ug via INTRAVENOUS

## 2022-10-28 MED ORDER — SODIUM CHLORIDE 0.9 % IV SOLN: INTRAVENOUS | Status: DC | PRN

## 2022-10-28 MED ORDER — ONDANSETRON HCL 4 MG/2ML IJ SOLN
INTRAMUSCULAR | Status: DC | PRN
  Administered 2022-10-28: 4 mg via INTRAVENOUS

## 2022-10-28 MED ORDER — HYDROCODONE-ACETAMINOPHEN 5-325 MG PO TABS
1.0000 | ORAL_TABLET | ORAL | Status: DC | PRN
  Administered 2022-10-28 – 2022-10-30 (×11): 2 via ORAL
  Filled 2022-10-28 (×11): qty 2

## 2022-10-28 MED ORDER — FENTANYL CITRATE (PF) 100 MCG/2ML IJ SOLN
25.0000 ug | INTRAMUSCULAR | Status: DC | PRN
  Administered 2022-10-28: 50 ug via INTRAVENOUS

## 2022-10-28 MED ORDER — MORPHINE SULFATE (PF) 2 MG/ML IV SOLN
1.0000 mg | INTRAVENOUS | Status: DC | PRN
  Administered 2022-10-28 – 2022-10-29 (×5): 1 mg via INTRAVENOUS
  Filled 2022-10-28 (×5): qty 1

## 2022-10-28 MED ORDER — ACETAMINOPHEN 500 MG PO TABS
1000.0000 mg | ORAL_TABLET | Freq: Once | ORAL | Status: AC
  Administered 2022-10-28: 1000 mg via ORAL

## 2022-10-28 MED ORDER — OXYCODONE HCL 5 MG PO TABS
ORAL_TABLET | ORAL | Status: AC
  Filled 2022-10-28: qty 1

## 2022-10-28 MED ORDER — PROMETHAZINE HCL 25 MG/ML IJ SOLN: 6.2500 mg | INTRAMUSCULAR | Status: DC | PRN

## 2022-10-28 MED ORDER — CELECOXIB 200 MG PO CAPS
ORAL_CAPSULE | ORAL | Status: AC
  Filled 2022-10-28: qty 1

## 2022-10-28 MED ORDER — MIDAZOLAM HCL 2 MG/2ML IJ SOLN
INTRAMUSCULAR | Status: DC | PRN
  Administered 2022-10-28: 2 mg via INTRAVENOUS

## 2022-10-28 MED ORDER — MIDAZOLAM HCL 2 MG/2ML IJ SOLN
INTRAMUSCULAR | Status: AC
  Filled 2022-10-28: qty 2

## 2022-10-28 MED ORDER — PROPOFOL 10 MG/ML IV BOLUS
INTRAVENOUS | Status: AC
  Filled 2022-10-28: qty 20

## 2022-10-28 MED ORDER — DROPERIDOL 2.5 MG/ML IJ SOLN: 0.6250 mg | Freq: Once | INTRAMUSCULAR | Status: DC | PRN

## 2022-10-28 MED ORDER — INSULIN GLARGINE-YFGN 100 UNIT/ML ~~LOC~~ SOLN
28.0000 [IU] | Freq: Every day | SUBCUTANEOUS | Status: DC
  Administered 2022-10-28 – 2022-10-29 (×2): 28 [IU] via SUBCUTANEOUS
  Filled 2022-10-28 (×3): qty 0.28

## 2022-10-28 MED ORDER — INSULIN ASPART 100 UNIT/ML IJ SOLN
12.0000 [IU] | Freq: Three times a day (TID) | INTRAMUSCULAR | Status: DC
  Administered 2022-10-29 – 2022-10-30 (×5): 12 [IU] via SUBCUTANEOUS
  Filled 2022-10-28 (×5): qty 1

## 2022-10-28 MED ORDER — FENTANYL CITRATE (PF) 100 MCG/2ML IJ SOLN
INTRAMUSCULAR | Status: DC | PRN
  Administered 2022-10-28: 25 ug via INTRAVENOUS
  Administered 2022-10-28: 50 ug via INTRAVENOUS
  Administered 2022-10-28: 25 ug via INTRAVENOUS

## 2022-10-28 MED ORDER — INSULIN ASPART 100 UNIT/ML IJ SOLN
10.0000 [IU] | Freq: Once | INTRAMUSCULAR | Status: AC
  Administered 2022-10-28: 10 [IU] via SUBCUTANEOUS
  Filled 2022-10-28: qty 1

## 2022-10-28 MED ORDER — ACETAMINOPHEN 500 MG PO TABS
ORAL_TABLET | ORAL | Status: AC
  Filled 2022-10-28: qty 2

## 2022-10-28 SURGICAL SUPPLY — 43 items
BNDG COHESIVE 6X5 TAN ST LF (GAUZE/BANDAGES/DRESSINGS) ×1 IMPLANT
BNDG ELASTIC 3X5.8 VLCR NS LF (GAUZE/BANDAGES/DRESSINGS) IMPLANT
BNDG STRETCH GAUZE 3IN X12FT (GAUZE/BANDAGES/DRESSINGS) IMPLANT
CUFF TOURN SGL QUICK 18X4 (TOURNIQUET CUFF) IMPLANT
CUFF TOURN SGL QUICK 24 (TOURNIQUET CUFF)
CUFF TRNQT CYL 24X4X16.5-23 (TOURNIQUET CUFF) IMPLANT
DRAIN PENROSE 12X.25 LTX STRL (MISCELLANEOUS) IMPLANT
DRAPE 3/4 80X56 (DRAPES) ×1 IMPLANT
DRAPE INCISE IOBAN 66X60 STRL (DRAPES) ×1 IMPLANT
DRAPE SURG 17X11 SM STRL (DRAPES) ×2 IMPLANT
DRAPE U-SHAPE 47X51 STRL (DRAPES) ×1 IMPLANT
DURAPREP 26ML APPLICATOR (WOUND CARE) ×3 IMPLANT
ELECT CAUTERY BLADE 6.4 (BLADE) ×1 IMPLANT
ELECT REM PT RETURN 9FT ADLT (ELECTROSURGICAL) ×1
ELECTRODE REM PT RTRN 9FT ADLT (ELECTROSURGICAL) ×1 IMPLANT
GAUZE SPONGE 4X4 12PLY STRL (GAUZE/BANDAGES/DRESSINGS) ×1 IMPLANT
GAUZE XEROFORM 1X8 LF (GAUZE/BANDAGES/DRESSINGS) ×1 IMPLANT
GLOVE BIOGEL PI IND STRL 9 (GLOVE) ×1 IMPLANT
GLOVE BIOGEL PI ORTHO SZ9 (GLOVE) ×6 IMPLANT
GOWN STRL REUS TWL 2XL XL LVL4 (GOWN DISPOSABLE) ×1 IMPLANT
GOWN STRL REUS W/ TWL LRG LVL3 (GOWN DISPOSABLE) ×1 IMPLANT
GOWN STRL REUS W/TWL LRG LVL3 (GOWN DISPOSABLE) ×1
HEMOVAC 400ML (MISCELLANEOUS)
KIT DRAIN HEMOVAC JP 7FR 400ML (MISCELLANEOUS) ×1 IMPLANT
KIT TURNOVER KIT A (KITS) ×1 IMPLANT
MANIFOLD NEPTUNE II (INSTRUMENTS) ×1 IMPLANT
NDL FILTER BLUNT 18X1 1/2 (NEEDLE) ×1 IMPLANT
NDL SAFETY ECLIP 18X1.5 (MISCELLANEOUS) ×1 IMPLANT
NEEDLE FILTER BLUNT 18X1 1/2 (NEEDLE) IMPLANT
NS IRRIG 1000ML POUR BTL (IV SOLUTION) IMPLANT
NS IRRIG 500ML POUR BTL (IV SOLUTION) ×1 IMPLANT
PACK EXTREMITY ARMC (MISCELLANEOUS) ×1 IMPLANT
STAPLER SKIN PROX 35W (STAPLE) ×1 IMPLANT
SUT ETHIBOND #5 BRAIDED 30INL (SUTURE) ×1 IMPLANT
SUT ETHILON 4 0 PS 2 18 (SUTURE) IMPLANT
SUT TICRON 2-0 30IN 311381 (SUTURE) ×1 IMPLANT
SUT VIC AB 0 CT1 27 (SUTURE)
SUT VIC AB 0 CT1 27XCR 8 STRN (SUTURE) ×1 IMPLANT
SUT VIC AB 1 CTX 27 (SUTURE) ×1 IMPLANT
SYR 10ML LL (SYRINGE) ×1 IMPLANT
TAPE MICROFOAM 4IN (TAPE) ×1 IMPLANT
TRAP FLUID SMOKE EVACUATOR (MISCELLANEOUS) ×1 IMPLANT
WATER STERILE IRR 500ML POUR (IV SOLUTION) ×1 IMPLANT

## 2022-10-28 NOTE — Anesthesia Postprocedure Evaluation (Signed)
Anesthesia Post Note  Patient: Marqueze D Marks  Procedure(s) Performed: IRRIGATION AND DEBRIDEMENT RIGHT HAND (Right: Hand)  Patient location during evaluation: PACU Anesthesia Type: General Level of consciousness: awake and alert Pain management: pain level controlled Vital Signs Assessment: post-procedure vital signs reviewed and stable Respiratory status: spontaneous breathing, nonlabored ventilation and respiratory function stable Cardiovascular status: blood pressure returned to baseline and stable Postop Assessment: no apparent nausea or vomiting Anesthetic complications: no   No notable events documented.   Last Vitals:  Vitals:   10/28/22 1257 10/28/22 1446  BP: 115/78 113/74  Pulse: 80 94  Resp: 17 18  Temp: (!) 36.3 C 36.5 C  SpO2: 100% 100%    Last Pain:  Vitals:   10/28/22 1446  TempSrc:   PainSc: 6                  Foye Deer

## 2022-10-28 NOTE — Op Note (Signed)
10/28/2022  11:43 AM  PATIENT:  Gregory Gibson    PRE-OPERATIVE DIAGNOSIS:  right thumb webspace abscess  POST-OPERATIVE DIAGNOSIS:  Same  PROCEDURE:  IRRIGATION AND DEBRIDEMENT RIGHT HAND ABSCESS  SURGEON:  Juanell Fairly, MD  ANESTHESIA:   General  TOURNIQUET TIME:  10 minutes  PREOPERATIVE INDICATIONS:  Gregory Gibson is a  47 y.o. male with a diagnosis of right hand infection who failed conservative measures and elected for surgical management.    I discussed the risks and benefits of surgery. The risks include but are not limited to recurrent infection, bleeding, nerve or blood vessel or tendon injury, joint stiffness or loss of motion, persistent pain, weakness or instability, permanent paresthesia and the need for further surgery. Medical risks include but are not limited to DVT and pulmonary embolism, myocardial infarction, stroke, pneumonia, respiratory failure and death. Patient understood these risks and wished to proceed.   OPERATIVE FINDINGS: purulent drainage from webspace near right thumb   OPERATIVE PROCEDURE: Patient was met in the preoperative area.  I marked the right hand according to the hospital's correct site of surgery protocol.  Patient was brought to the operating room was placed supine on the operative table.  He received sedation with an oxygen mask for this case.  Patient was prepped and draped in a sterile fashion.  A timeout was performed to verify the patient's name, date of birth, medical record number, correct site of surgery and correct procedure to be performed.  Once all in attendance were in agreement the case began.  Patient received 2 g of Ancef IV prior to the onset of the case.  Patient had a tourniquet applied to the right upper arm which was inflated a few minutes into the case to 250 mmHg for a total of 10 minutes.  An Esmarch was not used for this procedure.  The patient had a small opening, approximately 2 to 3 mm, at the edge of the webspace  approximately 1 cm ulnar to the proximal phalanx of the thumb.  Purulent fluid could be expressed from this opening.  A 15 blade was used to extend this opening a few millimeters in both the dorsal and volar directions.  A tonsil forceps was used to bluntly open the wound.  A culture swab was taken of the wound and sent to microbiology for Gram stain and culture.  The tonsil forceps was again used to bluntly dissect gently and carefully into the webspace to avoid any injury to the neurovascular structures or tendons.  Patient appeared to have an abscess pocket in the volar aspect of the webspace between the index and middle finger tracking towards the index finger.  The wound was then copiously irrigated with 1 L of saline.  1/4 inch Penrose drain was placed into the wound.  The Penrose drain filled the wound and no sutures were needed.  Dry sterile dressings were applied and the tourniquet was deflated at 10 minutes.  I was scrubbed and present for the entire case.  All sharp, sponge and instruments were accounted for at the conclusion of the case.

## 2022-10-28 NOTE — Progress Notes (Signed)
Progress Note   Gregory Gibson: Gregory Gibson ZOX:[id number] DOB: [birth year] DOA: 10/24/2022     3 DOS: the Gregory Gibson was seen and examined on 10/28/2022   Brief hospital course: 47 y.o. male with medical history significant of uncontrolled type 2 diabetes, HTN, severe diffuse eczema on Dupixent, hyperlipidemia, who presents to the ED due to hand swelling and pain.   Gregory Gibson states that yesterday he noticed that his hand was beginning to swell and and painful particularly around the thumb.  Then, his symptoms became suddenly, significantly worse overnight.  Today, he has had severe pain with difficulty moving his fingers due to the pain.  He denies any injuries or trauma to the affected hand.  He endorses chills but denies any fever, nausea, vomiting, diarrhea, chest pain, shortness of breath or palpitations.  He notes a history of severe eczema and has been started on Dupixent without significant improvement.  He reports he has difficulty not scratching his eczema rashes.  5/8.  MRI showed tenosynovitis of the right hand and no abscess.  Continue IV antibiotics.  Gregory Gibson still with quite a bit of swelling and pain. 5/9.  Swelling on the hand a little bit less.  Still swollen with his thumb with limited motion but able to make a fist with his other fingers. 5/10.  Gregory Gibson started having a little drainage of his hand yesterday.  Will give warm soaks and send off culture today.  Continue IV antibiotics. 5/11.  Gregory Gibson still having drainage from his hand.  To operating room today for incision and drainage.  Assessment and Plan: * Cellulitis of hand Continue IV antibiotics with swelling of the right hand and thumb.  Gregory Gibson still having drainage from his hand.  Operating room today for incision and drainage.  Culture from yesterday shows no growth.  Tenosynovitis of finger and hand Continue IV antibiotics.    Uncontrolled type 2 diabetes mellitus with hyperglycemia, without long-term current use of insulin  (HCC) Switch to insulin with A1c being elevated at 11.8.  Continue Semglee insulin 25 units at night and short acting insulin 12 units prior to meals plus sliding scale.  Depression On Zoloft  Eczema Follow-up with dermatology as outpatient.  States he has been off Dupixent for a while since he has not followed up.  Hyperlipidemia Continue Lipitor and Tricor  Essential hypertension Blood pressure on the lower side will hold lisinopril for now.        Subjective: Gregory Gibson seen this morning and was still having drainage from his right hand.  Culture from yesterday did not show any organisms.  Taken to the OR today for incision and drainage.  Admitted with cellulitis and tenosynovitis.  Physical Exam: Vitals:   10/28/22 1203 10/28/22 1215 10/28/22 1226 10/28/22 1230  BP:  (!) 118/91    Pulse: 65 68 73 73  Resp: 14 16 16 13   Temp:   (!) 97.3 F (36.3 C)   TempSrc:      SpO2: 100% 100% 100% 100%  Weight:      Height:       Physical Exam HENT:     Head: Normocephalic.     Mouth/Throat:     Pharynx: No oropharyngeal exudate.  Eyes:     General: Lids are normal.     Conjunctiva/sclera: Conjunctivae normal.  Cardiovascular:     Rate and Rhythm: Normal rate and regular rhythm.     Heart sounds: Normal heart sounds, S1 normal and S2 normal.  Pulmonary:  Breath sounds: No decreased breath sounds, wheezing, rhonchi or rales.  Abdominal:     Palpations: Abdomen is soft.     Tenderness: There is no abdominal tenderness.  Musculoskeletal:     Right hand: Swelling and tenderness present. Decreased range of motion.     Comments: Better range of motion fingers.  Still limited range of motion with thumb secondary to swelling.  Skin:    General: Skin is warm.     Comments: Darkened discoloration bilateral arms and hands.  Scaling on the skin.  Neurological:     Mental Status: He is alert and oriented to person, place, and time.     Data Reviewed: Culture from yesterday does  not show any growth. Sodium 131.  Creatinine 0.76, white blood cell count 10.2, hemoglobin 11.1  Disposition: Status is: Inpatient Remains inpatient appropriate because: Operating room for incision and drainage today  Planned Discharge Destination: Home    Time spent: 27 minutes  Author: Alford Highland, MD 10/28/2022 12:44 PM  For on call review [email or web address].

## 2022-10-28 NOTE — Anesthesia Procedure Notes (Signed)
Procedure Name: MAC Date/Time: 10/28/2022 11:00 AM  Performed by: Elmarie Mainland, CRNAPre-anesthesia Checklist: Patient identified, Emergency Drugs available and Suction available Patient Re-evaluated:Patient Re-evaluated prior to induction Oxygen Delivery Method: Simple face mask

## 2022-10-28 NOTE — Anesthesia Preprocedure Evaluation (Addendum)
Anesthesia Evaluation  Patient identified by MRN, date of birth, ID band Patient awake    Reviewed: Allergy & Precautions, H&P , NPO status , Patient's Chart, lab work & pertinent test results  Airway Mallampati: II  TM Distance: >3 FB Neck ROM: full    Dental no notable dental hx.    Pulmonary Current Smoker and Patient abstained from smoking.   Pulmonary exam normal        Cardiovascular hypertension, Pt. on medications Normal cardiovascular exam     Neuro/Psych  PSYCHIATRIC DISORDERS  Depression    negative neurological ROS     GI/Hepatic negative GI ROS, Neg liver ROS,,,  Endo/Other  diabetes, Poorly Controlled, Type 2    Renal/GU      Musculoskeletal   Abdominal Normal abdominal exam  (+)   Peds  Hematology  (+) Blood dyscrasia, anemia   Anesthesia Other Findings Cellulitis of hand  Past Medical History: No date: Diabetes mellitus without complication (HCC) No date: Hypertension  Past Surgical History: No date: APPENDECTOMY  BMI    Body Mass Index: 25.80 kg/m      Reproductive/Obstetrics negative OB ROS                             Anesthesia Physical Anesthesia Plan  ASA: 3  Anesthesia Plan: General   Post-op Pain Management: Tylenol PO (pre-op)* and Celebrex PO (pre-op)*   Induction: Intravenous  PONV Risk Score and Plan: Dexamethasone, Ondansetron, Midazolam and Treatment may vary due to age or medical condition  Airway Management Planned: Natural Airway  Additional Equipment:   Intra-op Plan:   Post-operative Plan:   Informed Consent: I have reviewed the patients History and Physical, chart, labs and discussed the procedure including the risks, benefits and alternatives for the proposed anesthesia with the patient or authorized representative who has indicated his/her understanding and acceptance.     Dental Advisory Given  Plan Discussed with:  Anesthesiologist, CRNA and Surgeon  Anesthesia Plan Comments:         Anesthesia Quick Evaluation

## 2022-10-28 NOTE — Transfer of Care (Signed)
Immediate Anesthesia Transfer of Care Note  Patient: Gregory Gibson  Procedure(s) Performed: IRRIGATION AND DEBRIDEMENT RIGHT HAND (Right)  Patient Location: PACU  Anesthesia Type:General  Level of Consciousness: drowsy and patient cooperative  Airway & Oxygen Therapy: Patient Spontanous Breathing and Patient connected to face mask oxygen  Post-op Assessment: Report given to RN and Post -op Vital signs reviewed and stable  Post vital signs: Reviewed and stable  Last Vitals:  Vitals Value Taken Time  BP 108/68 10/28/22 1138  Temp    Pulse 58 10/28/22 1140  Resp 15 10/28/22 1140  SpO2 100 % 10/28/22 1140  Vitals shown include unvalidated device data.  Last Pain:  Vitals:   10/28/22 0831  TempSrc:   PainSc: 7       Patients Stated Pain Goal: 0 (10/26/22 1756)  Complications: No notable events documented.

## 2022-10-29 ENCOUNTER — Encounter: Payer: Self-pay | Admitting: Orthopedic Surgery

## 2022-10-29 DIAGNOSIS — L03113 Cellulitis of right upper limb: Secondary | ICD-10-CM

## 2022-10-29 DIAGNOSIS — L02511 Cutaneous abscess of right hand: Secondary | ICD-10-CM

## 2022-10-29 DIAGNOSIS — E1165 Type 2 diabetes mellitus with hyperglycemia: Secondary | ICD-10-CM | POA: Diagnosis not present

## 2022-10-29 DIAGNOSIS — M659 Synovitis and tenosynovitis, unspecified: Secondary | ICD-10-CM | POA: Diagnosis not present

## 2022-10-29 LAB — GLUCOSE, CAPILLARY
Glucose-Capillary: 303 mg/dL — ABNORMAL HIGH (ref 70–99)
Glucose-Capillary: 221 mg/dL — ABNORMAL HIGH (ref 70–99)
Glucose-Capillary: 164 mg/dL — ABNORMAL HIGH (ref 70–99)
Glucose-Capillary: 217 mg/dL — ABNORMAL HIGH (ref 70–99)

## 2022-10-29 LAB — CULTURE, BLOOD (ROUTINE X 2)

## 2022-10-29 LAB — AEROBIC CULTURE W GRAM STAIN (SUPERFICIAL SPECIMEN)

## 2022-10-29 MED ORDER — LISINOPRIL 10 MG PO TABS
10.0000 mg | ORAL_TABLET | Freq: Every day | ORAL | Status: DC
  Administered 2022-10-29 – 2022-10-30 (×2): 10 mg via ORAL
  Filled 2022-10-29 (×2): qty 1

## 2022-10-29 NOTE — Progress Notes (Signed)
Progress Note   Patient: Gregory Gibson ZOX:[id number] DOB: 11-29-1975 DOA: 10/24/2022     4 DOS: the patient was seen and examined on 10/29/2022   Brief hospital course: 47 y.o. male with medical history significant of uncontrolled type 2 diabetes, HTN, severe diffuse eczema on Dupixent, hyperlipidemia, who presents to the ED due to hand swelling and pain.   Mr. Moix states that yesterday he noticed that his hand was beginning to swell and and painful particularly around the thumb.  Then, his symptoms became suddenly, significantly worse overnight.  Today, he has had severe pain with difficulty moving his fingers due to the pain.  He denies any injuries or trauma to the affected hand.  He endorses chills but denies any fever, nausea, vomiting, diarrhea, chest pain, shortness of breath or palpitations.  He notes a history of severe eczema and has been started on Dupixent without significant improvement.  He reports he has difficulty not scratching his eczema rashes.  5/8.  MRI showed tenosynovitis of the right hand and no abscess.  Continue IV antibiotics.  Patient still with quite a bit of swelling and pain. 5/9.  Swelling on the hand a little bit less.  Still swollen with his thumb with limited motion but able to make a fist with his other fingers. 5/10.  Patient started having a little drainage of his hand yesterday.  Will give warm soaks and send off culture today.  Continue IV antibiotics. 5/11.  Patient still having drainage from his hand.  To operating room today for incision and drainage. 5/12.  Postoperative day 1 for abscess drainage.  So far nothing growing out of the cultures.  Assessment and Plan: * Abscess of hand, right Status post I&D on 5/11.  Continue antibiotics.  So far nothing growing out of the operative culture.  Cellulitis of right hand Continue IV antibiotics  Tenosynovitis of finger and hand Continue IV antibiotics.    Uncontrolled type 2 diabetes mellitus with  hyperglycemia, without long-term current use of insulin (HCC) Switch to insulin with A1c being elevated at 11.8.  Continue Semglee insulin 28 units at night and short acting insulin 12 units prior to meals plus sliding scale.  Depression On Zoloft  Eczema Follow-up with dermatology as outpatient.  States he has been off Dupixent for a while since he has not followed up.  Hyperlipidemia Continue Lipitor and Tricor  Essential hypertension Restart lisinopril        Subjective: Patient having postoperative discomfort today.  So far wound cultures are negative.  Admitted with right hand cellulitis and tenosynovitis and developed abscess.  Status post I&D on 5/11.  Physical Exam: Vitals:   10/28/22 1257 10/28/22 1446 10/29/22 0022 10/29/22 0827  BP: 115/78 113/74 129/87 (!) 138/96  Pulse: 80 94 83 84  Resp: 17 18 16    Temp: (!) 97.4 F (36.3 C) 97.7 F (36.5 C) 98.1 F (36.7 C) 98.3 F (36.8 C)  TempSrc: Oral     SpO2: 100% 100% 100% 100%  Weight:      Height:       Physical Exam HENT:     Head: Normocephalic.     Mouth/Throat:     Pharynx: No oropharyngeal exudate.  Eyes:     General: Lids are normal.     Conjunctiva/sclera: Conjunctivae normal.  Cardiovascular:     Rate and Rhythm: Normal rate and regular rhythm.     Heart sounds: Normal heart sounds, S1 normal and S2 normal.  Pulmonary:  Breath sounds: No decreased breath sounds, wheezing, rhonchi or rales.  Abdominal:     Palpations: Abdomen is soft.     Tenderness: There is no abdominal tenderness.  Musculoskeletal:     Right hand: Swelling and tenderness present. Decreased range of motion.     Comments: Better range of motion fingers.  Still limited range of motion with thumb secondary to swelling.  Skin:    General: Skin is warm.     Comments: Darkened discoloration bilateral arms and hands.  Scaling on the skin.  Right hand in postsurgical bandage.  Neurological:     Mental Status: He is alert and  oriented to person, place, and time.     Data Reviewed: Glucose down to 164 No organisms in wound culture or operative culture.  Disposition: Status is: Inpatient Remains inpatient appropriate because: Postoperative day 1 for abscess drainage.  Planned Discharge Destination: Home    Time spent: 27 minutes  Author: Alford Highland, MD 10/29/2022 12:43 PM  For on call review [email or web address].

## 2022-10-29 NOTE — Plan of Care (Signed)

## 2022-10-29 NOTE — Progress Notes (Signed)
Subjective: 1 Day Post-Op Procedure(s) (LRB): IRRIGATION AND DEBRIDEMENT RIGHT HAND (Right) Patient is alert awake and oriented.  He says his pain is improved.  Is mild to moderate now.  He is able to move his fingers.  He says his sugars better controlled and his fevers down.  Patient reports pain as mild.  Objective:   VITALS:   Vitals:   10/29/22 0827 10/29/22 1501  BP: (!) 138/96 131/88  Pulse: 84 86  Resp:    Temp: 98.3 F (36.8 C) 98.4 F (36.9 C)  SpO2: 100% 100%    Neurologically intact Incision: dressing C/D/I  LABS Recent Labs    10/28/22 0404  HGB 11.1*  HCT 32.6*  WBC 10.2  PLT 344    Recent Labs    10/27/22 0336 10/28/22 0404  NA  --  131*  K  --  4.3  BUN  --  19  CREATININE 0.85 0.76  GLUCOSE  --  429*    No results for input(s): "LABPT", "INR" in the last 72 hours.   Assessment/Plan: 1 Day Post-Op Procedure(s) (LRB): IRRIGATION AND DEBRIDEMENT RIGHT HAND (Right)  Continue IV antibiotics. Dr. Martha Clan will change the dressings and pull the drain tomorrow. Repeat CBC tomorrow.

## 2022-10-29 NOTE — Assessment & Plan Note (Addendum)
Status post I&D on 5/11.  So far nothing growing out of the operative culture.  Switched over to p.o. doxycycline for 10 days upon discharge.  Follow-up with Dr. Martha Clan on Friday.  Keep dressing intact until then.

## 2022-10-30 DIAGNOSIS — E1165 Type 2 diabetes mellitus with hyperglycemia: Secondary | ICD-10-CM | POA: Diagnosis not present

## 2022-10-30 DIAGNOSIS — L03113 Cellulitis of right upper limb: Secondary | ICD-10-CM | POA: Diagnosis not present

## 2022-10-30 DIAGNOSIS — L02511 Cutaneous abscess of right hand: Secondary | ICD-10-CM | POA: Diagnosis not present

## 2022-10-30 DIAGNOSIS — M659 Synovitis and tenosynovitis, unspecified: Secondary | ICD-10-CM | POA: Diagnosis not present

## 2022-10-30 DIAGNOSIS — F3289 Other specified depressive episodes: Secondary | ICD-10-CM

## 2022-10-30 LAB — BASIC METABOLIC PANEL
Anion gap: 8 (ref 5–15)
BUN: 18 mg/dL (ref 6–20)
CO2: 26 mmol/L (ref 22–32)
Calcium: 9.3 mg/dL (ref 8.9–10.3)
Chloride: 96 mmol/L — ABNORMAL LOW (ref 98–111)
Creatinine, Ser: 0.79 mg/dL (ref 0.61–1.24)
GFR, Estimated: >60 mL/min (ref >60)
Glucose, Bld: 402 mg/dL — ABNORMAL HIGH (ref 70–99)
Potassium: 4.5 mmol/L (ref 3.5–5.1)
Sodium: 130 mmol/L — ABNORMAL LOW (ref 135–145)

## 2022-10-30 LAB — CBC
HCT: 34 % — ABNORMAL LOW (ref 39.0–52.0)
Hemoglobin: 11.4 g/dL — ABNORMAL LOW (ref 13.0–17.0)
MCH: 22 pg — ABNORMAL LOW (ref 26.0–34.0)
MCHC: 33.5 g/dL (ref 30.0–36.0)
MCV: 65.6 fL — ABNORMAL LOW (ref 80.0–100.0)
Platelets: 393 10*3/uL (ref 150–400)
RBC: 5.18 MIL/uL (ref 4.22–5.81)
RDW: 13.7 % (ref 11.5–15.5)
WBC: 7.2 10*3/uL (ref 4.0–10.5)
nRBC: 0 % (ref 0.0–0.2)

## 2022-10-30 LAB — CULTURE, BLOOD (ROUTINE X 2): Culture: NO GROWTH

## 2022-10-30 LAB — AEROBIC CULTURE W GRAM STAIN (SUPERFICIAL SPECIMEN): Special Requests: NORMAL

## 2022-10-30 LAB — GLUCOSE, CAPILLARY
Glucose-Capillary: 281 mg/dL — ABNORMAL HIGH (ref 70–99)
Glucose-Capillary: 364 mg/dL — ABNORMAL HIGH (ref 70–99)
Glucose-Capillary: 214 mg/dL — ABNORMAL HIGH (ref 70–99)
Glucose-Capillary: 208 mg/dL — ABNORMAL HIGH (ref 70–99)

## 2022-10-30 MED ORDER — LANCETS MISC
1.0000 | Freq: Three times a day (TID) | 0 refills | Status: DC
Start: 2022-10-30 — End: 2022-10-30

## 2022-10-30 MED ORDER — LANCET DEVICE MISC
1.0000 | Freq: Three times a day (TID) | 0 refills | Status: AC
Start: 2022-10-30 — End: ?

## 2022-10-30 MED ORDER — HYDROCODONE-ACETAMINOPHEN 5-325 MG PO TABS: 1.0000 | ORAL_TABLET | Freq: Four times a day (QID) | ORAL | 0 refills | Status: AC | PRN

## 2022-10-30 MED ORDER — INSULIN LISPRO (1 UNIT DIAL) 100 UNIT/ML (KWIKPEN)
0.0000 [IU] | PEN_INJECTOR | Freq: Three times a day (TID) | SUBCUTANEOUS | 0 refills | Status: AC
Start: 2022-10-30 — End: ?

## 2022-10-30 MED ORDER — PEN NEEDLES 31G X 5 MM MISC
1.0000 | Freq: Three times a day (TID) | 0 refills | Status: DC
Start: 2022-10-30 — End: 2022-10-30

## 2022-10-30 MED ORDER — INSULIN GLARGINE-YFGN 100 UNIT/ML ~~LOC~~ SOPN
32.0000 [IU] | PEN_INJECTOR | Freq: Every day | SUBCUTANEOUS | 0 refills | Status: DC
Start: 2022-10-30 — End: 2022-10-30

## 2022-10-30 MED ORDER — BLOOD GLUCOSE TEST VI STRP
1.0000 | ORAL_STRIP | Freq: Three times a day (TID) | 0 refills | Status: AC
Start: 2022-10-30 — End: ?

## 2022-10-30 MED ORDER — BLOOD GLUCOSE TEST VI STRP
1.0000 | ORAL_STRIP | Freq: Three times a day (TID) | 0 refills | Status: DC
Start: 2022-10-30 — End: 2022-10-30

## 2022-10-30 MED ORDER — INSULIN LISPRO (1 UNIT DIAL) 100 UNIT/ML (KWIKPEN)
0.0000 [IU] | PEN_INJECTOR | Freq: Three times a day (TID) | SUBCUTANEOUS | 0 refills | Status: DC
Start: 2022-10-30 — End: 2022-10-30

## 2022-10-30 MED ORDER — BD PEN NEEDLE NANO U/F 32G X 4 MM MISC: 1.0000 | Freq: Three times a day (TID) | 0 refills | Status: DC

## 2022-10-30 MED ORDER — LANCET DEVICE MISC: 1.0000 | Freq: Three times a day (TID) | 0 refills | Status: DC

## 2022-10-30 MED ORDER — BLOOD GLUCOSE MONITORING SUPPL DEVI: 1.0000 | Freq: Three times a day (TID) | 0 refills | Status: DC

## 2022-10-30 MED ORDER — DILTIAZEM HCL ER COATED BEADS 120 MG PO CP24
120.0000 mg | ORAL_CAPSULE | Freq: Every day | ORAL | Status: DC
  Filled 2022-10-30: qty 1

## 2022-10-30 MED ORDER — DOXYCYCLINE HYCLATE 100 MG PO TABS: 100.0000 mg | ORAL_TABLET | Freq: Two times a day (BID) | ORAL | 0 refills | Status: AC

## 2022-10-30 MED ORDER — BLOOD GLUCOSE MONITORING SUPPL DEVI: 1.0000 | Freq: Three times a day (TID) | 0 refills | Status: AC

## 2022-10-30 MED ORDER — LANCETS MISC
1.0000 | Freq: Three times a day (TID) | 0 refills | Status: AC
Start: 2022-10-30 — End: ?

## 2022-10-30 MED ORDER — INSULIN LISPRO (1 UNIT DIAL) 100 UNIT/ML (KWIKPEN): 0.0000 [IU] | PEN_INJECTOR | Freq: Three times a day (TID) | SUBCUTANEOUS | 0 refills | Status: DC

## 2022-10-30 MED ORDER — DOXYCYCLINE HYCLATE 100 MG PO TABS
100.0000 mg | ORAL_TABLET | Freq: Two times a day (BID) | ORAL | Status: DC
  Administered 2022-10-30: 100 mg via ORAL
  Filled 2022-10-30: qty 1

## 2022-10-30 MED ORDER — BLOOD GLUCOSE MONITORING SUPPL DEVI
1.0000 | Freq: Three times a day (TID) | 0 refills | Status: DC
Start: 2022-10-30 — End: 2022-10-30

## 2022-10-30 MED ORDER — INSULIN GLARGINE-YFGN 100 UNIT/ML ~~LOC~~ SOPN
32.0000 [IU] | PEN_INJECTOR | Freq: Every day | SUBCUTANEOUS | 0 refills | Status: AC
Start: 2022-10-30 — End: ?

## 2022-10-30 NOTE — Progress Notes (Signed)
   10/30/22 1300  Spiritual Encounters  Type of Visit Follow up  Care provided to: Pt and family  Referral source Chaplain assessment  Reason for visit Routine spiritual support  OnCall Visit No  Spiritual Framework  Presenting Themes Community and relationships  Community/Connection Family;Significant other  Patient Stress Factors Not reviewed  Family Stress Factors Not reviewed  Interventions  Spiritual Care Interventions Made Established relationship of care and support;Compassionate presence;Reflective listening  Spiritual Care Plan  Spiritual Care Issues Still Outstanding No further spiritual care needs at this time (see row info)   Spoke with patient and they are heading home today. Patient was in good spirit and was excited to go home.

## 2022-10-30 NOTE — Inpatient Diabetes Management (Signed)
Inpatient Diabetes Program Recommendations  AACE/ADA: New Consensus Statement on Inpatient Glycemic Control (2015)  Target Ranges:  Prepandial:   less than 140 mg/dL      Peak postprandial:   less than 180 mg/dL (1-2 hours)      Critically ill patients:  140 - 180 mg/dL   Lab Results  Component Value Date   GLUCAP 214 (H) 10/30/2022   HGBA1C 11.8 (H) 10/24/2022    Review of Glycemic Control  Latest Reference Range & Units 10/29/22 01:27 10/29/22 08:13 10/29/22 11:58 10/29/22 16:21 10/29/22 21:36 10/30/22 07:56  Glucose-Capillary 70 - 99 mg/dL 161 (H) 096 (H) 045 (H) 217 (H) 364 (H) 214 (H)   Diabetes history: DM 2 Outpatient Diabetes medications: Glipizide 10 mg BID, Metformin 1000 mg BID, Jardiance 25 mg daily Current orders for Inpatient glycemic control: Semglee 28 units QHS, Novolog 0-20 units TID with meals, Novolog 12 units TID with meals Inpatient Diabetes Program Recommendations:    Briefly spoke to MD regarding d/c recommendations for patient. Also briefly visited with patient regarding d/c plans and insulins.  He states he wants to wait until he gets home to start the [facility] sensor. We also reviewed signs, symptoms and treatment of hypoglycemia. Patient verbalized understanding and teach back.    Discharge Recommendations: Other recommendations: Dexcom G7 sensors (#[id number]) Long acting recommendations: Insulin Glargine-yfgn (SEMGLEE) Pen 32 units QHS  Short acting recommendations:  Meal + Correction coverage Insulin lispro (HUMALOG) KwikPen  Moderate Scale.  10 units meal coverage/ Dose range 0-21 units Supply/Referral recommendations: Pen needles - standard Referral to Nutrition & Diab Services  Thanks,  Beryl Meager, RN, BC-ADM Inpatient Diabetes Coordinator Pager [phone]  (8a-5p)

## 2022-10-30 NOTE — Discharge Summary (Signed)
Physician Discharge Summary   Patient: Gregory Gibson MRN: [id number] DOB: 25-Aug-1975  Admit date:     10/24/2022  Discharge date: 10/30/22  Discharge Physician: Alford Highland   PCP: [facility], [facility]   Recommendations at discharge:   Follow-up PCP 1 week Follow-up with Dr. Martha Clan on Friday 5/17.  Discharge Diagnoses: Principal Problem:   Abscess of hand, right Active Problems:   Cellulitis of right hand   Tenosynovitis of finger and hand   Uncontrolled type 2 diabetes mellitus with hyperglycemia, without long-term current use of insulin (HCC)   Essential hypertension   Hyperlipidemia   Eczema   Depression   Hospital Course: 47 y.o. male with medical history significant of uncontrolled type 2 diabetes, HTN, severe diffuse eczema on Dupixent, hyperlipidemia, who presents to the ED due to hand swelling and pain.   Gregory Gibson states that yesterday he noticed that his hand was beginning to swell and and painful particularly around the thumb.  Then, his symptoms became suddenly, significantly worse overnight.  Today, he has had severe pain with difficulty moving his fingers due to the pain.  He denies any injuries or trauma to the affected hand.  He endorses chills but denies any fever, nausea, vomiting, diarrhea, chest pain, shortness of breath or palpitations.  He notes a history of severe eczema and has been started on Dupixent without significant improvement.  He reports he has difficulty not scratching his eczema rashes.  5/8.  MRI showed tenosynovitis of the right hand and no abscess.  Continue IV antibiotics.  Patient still with quite a bit of swelling and pain. 5/9.  Swelling on the hand a little bit less.  Still swollen with his thumb with limited motion but able to make a fist with his other fingers. 5/10.  Patient started having a little drainage of his hand yesterday.  Will give warm soaks and send off culture today.  Continue IV antibiotics. 5/11.  Patient  still having drainage from his hand.  To operating room today for incision and drainage. 5/12.  Postoperative day 1 for abscess drainage.  So far nothing growing out of the cultures. 5/13.  Postoperative day 2 for abscess drainage.  Again nothing growing out of the cultures.  Switched over to p.o. doxycycline for 10 days upon going home.  Follow-up with Dr. Martha Clan on Friday.  Keep hand in dressing intact.  Assessment and Plan: * Abscess of hand, right Status post I&D on 5/11.  So far nothing growing out of the operative culture.  Switched over to p.o. doxycycline for 10 days upon discharge.  Follow-up with Dr. Martha Clan on Friday.  Keep dressing intact until then.  Cellulitis of right hand IV antibiotics switched over to oral today  Tenosynovitis of finger and hand IV antibiotics switched over to oral.  Uncontrolled type 2 diabetes mellitus with hyperglycemia, without long-term current use of insulin (HCC) Switch to insulin with A1c being elevated at 11.8.  Increase Semglee insulin 32 units at night and short acting insulin 10 units prior to meals plus sliding scale.  Upon going home.  Prescription written for diabetic supplies and insulin upon going home.  Can stop oral diabetic medications.  Depression On Zoloft  Eczema Follow-up with dermatology as outpatient.  States he has been off Dupixent for a while since he has not followed up.  Hyperlipidemia Continue Lipitor and Tricor  Essential hypertension Continue lisinopril         Consultants: Orthopedic surgery Procedures performed: I&D on 5/11  by Dr. Martha Clan Disposition: Home Diet recommendation:  Cardiac diet DISCHARGE MEDICATION: Allergies as of 10/30/2022       Reactions   Gadavist [gadobutrol] Nausea And Vomiting   Patient vomits immediately  after injection        Medication List     STOP taking these medications    glipiZIDE 10 MG tablet Commonly known as: Glucotrol   Jardiance 25 MG Tabs  tablet Generic drug: empagliflozin   metFORMIN 500 MG 24 hr tablet Commonly known as: GLUCOPHAGE-XR   tiZANidine 4 MG tablet Commonly known as: Zanaflex       TAKE these medications    atorvastatin 20 MG tablet Commonly known as: LIPITOR Take 20 mg by mouth daily.   BD Pen Needle Nano U/F 32G X 4 MM Misc Generic drug: Insulin Pen Needle 1 Dose by Does not apply route 3 (three) times daily. May dispense any manufacturer covered by patient's insurance.   Blood Glucose Monitoring Suppl Devi 1 each by Does not apply route 3 (three) times daily. May dispense any manufacturer covered by patient's insurance.   BLOOD GLUCOSE TEST STRIPS Strp 1 each by Does not apply route 3 (three) times daily. Use as directed to check blood sugar. May dispense any manufacturer covered by patient's insurance and fits patient's device.   clobetasol cream 0.05 % Commonly known as: TEMOVATE Apply 1 Application topically 2 (two) times a week.   doxycycline 100 MG tablet Commonly known as: VIBRA-TABS Take 1 tablet (100 mg total) by mouth every 12 (twelve) hours for 10 days.   fenofibrate 145 MG tablet Commonly known as: TRICOR Take 145 mg by mouth daily.   HYDROcodone-acetaminophen 5-325 MG tablet Commonly known as: NORCO/VICODIN Take 1 tablet by mouth every 6 (six) hours as needed for up to 4 days for severe pain.   insulin glargine-yfgn 100 UNIT/ML Pen Commonly known as: SEMGLEE Inject 32 Units into the skin at bedtime. May substitute as needed per insurance.   insulin lispro 100 UNIT/ML KwikPen Commonly known as: HUMALOG Inject 0-21 Units into the skin 3 (three) times daily with meals. If eating and Blood Glucose (BG) 80 or higher inject 10 units for meal coverage and add correction dose per scale. If not eating, correction dose only. BG <150= 0 unit; BG 150-200= 1 unit; BG 201-250= 3 unit; BG 251-300= 5 unit; BG 301-350= 7 unit; BG 351-400= 9 unit; BG >400= 11 unit and Call Primary Care.    Lancet Device Misc 1 each by Does not apply route 3 (three) times daily. May dispense any manufacturer covered by patient's insurance.   Lancets Misc 1 each by Does not apply route 3 (three) times daily. Use as directed to check blood sugar. May dispense any manufacturer covered by patient's insurance and fits patient's device.   lisinopril 10 MG tablet Commonly known as: ZESTRIL Take 10 mg by mouth daily.   sertraline 50 MG tablet Commonly known as: ZOLOFT Take 50 mg by mouth daily.   triamcinolone cream 0.1 % Commonly known as: KENALOG PLEASE SEE ATTACHED FOR DETAILED DIRECTIONS        Follow-up Information     [facility] Follow up in 1 week(s).   Specialty: General Practice Contact information: [address] [place] [address] [phone]         Juanell Fairly, MD Follow up in 4 day(s).   Specialty: Orthopedic Surgery Contact information: [address] [place] [address] [phone]  Discharge Exam: Filed Weights   10/24/22 1907  Weight: 83.9 kg   Physical Exam HENT:     Head: Normocephalic.     Mouth/Throat:     Pharynx: No oropharyngeal exudate.  Eyes:     General: Lids are normal.     Conjunctiva/sclera: Conjunctivae normal.  Cardiovascular:     Rate and Rhythm: Normal rate and regular rhythm.     Heart sounds: Normal heart sounds, S1 normal and S2 normal.  Pulmonary:     Breath sounds: No decreased breath sounds, wheezing, rhonchi or rales.  Abdominal:     Palpations: Abdomen is soft.     Tenderness: There is no abdominal tenderness.  Skin:    General: Skin is warm.     Comments: Darkened discoloration bilateral arms and hands.  Scaling on the skin.  Right hand in postsurgical bandage.  Neurological:     Mental Status: He is alert and oriented to person, place, and time.      Condition at discharge: stable  The results of significant diagnostics from this hospitalization  (including imaging, microbiology, ancillary and laboratory) are listed below for reference.   Imaging Studies: MR HAND RIGHT W WO CONTRAST  Result Date: 10/25/2022 CLINICAL DATA:  Soft tissue infection suspected. Uncontrolled type 2 diabetes. EXAM: MRI OF THE RIGHT HAND WITHOUT AND WITH CONTRAST TECHNIQUE: Multiplanar, multisequence MR imaging of the right hand was performed before and after the administration of intravenous contrast. CONTRAST:  8mL GADAVIST GADOBUTROL 1 MMOL/ML IV SOLN COMPARISON:  Radiographs dated Oct 24, 2022 FINDINGS: Bones/Joint/Cartilage Marrow signal is within normal limits. No evidence of fracture or dislocation. No evidence of osteomyelitis. No appreciable joint effusion. Ligaments Collateral ligaments are intact. Muscles and Tendons Edema along the flexor and extensor tendons. No fluid collection or abscess. Mild edema of the thenar and hypothenar muscles. Soft tissues Heterogeneous soft tissue enhancement about the first web space suspicious for infectious/inflammatory process. No definite abscess or fluid collection. Generalized soft tissue edema about the dorsum of the hand. IMPRESSION: 1. Heterogeneous soft tissue enhancement about the first web space suspicious for infectious/inflammatory process. No definite abscess or fluid collection. 2. Edema along the flexor and extensor tendons suggesting tenosynovitis without fluid collection or abscess. 3. Mild edema of the thenar and hypothenar muscles. No intramuscular fluid collection or abscess. 4. No evidence of osteomyelitis or joint effusion. Electronically Signed   By: Larose Hires D.O.   On: 10/25/2022 09:20   DG Hand Complete Right  Result Date: 10/24/2022 CLINICAL DATA:  Hand swelling EXAM: RIGHT HAND - COMPLETE 3 VIEW COMPARISON:  None Available. FINDINGS: No evidence of fracture or dislocation. Moderate degenerative changes of the distal fourth IP joint. Mild degenerative changes of the fifth carpal metacarpal joint. Soft  tissues are unremarkable. IMPRESSION: No acute osseous abnormality. Electronically Signed   By: Allegra Lai M.D.   On: 10/24/2022 19:41    Microbiology: Results for orders placed or performed during the hospital encounter of 10/24/22  Blood culture (routine x 2)     Status: None   Collection Time: 10/24/22  8:42 PM   Specimen: BLOOD RIGHT ARM  Result Value Ref Range Status   Specimen Description BLOOD RIGHT ARM  Final   Special Requests   Final    BOTTLES DRAWN AEROBIC AND ANAEROBIC Blood Culture adequate volume   Culture   Final    NO GROWTH 5 DAYS Performed at [facility], [address], [place], [place] [address]    Report  Status 10/29/2022 FINAL  Final  Culture, blood (Routine X 2) w Reflex to ID Panel     Status: None   Collection Time: 10/25/22  2:00 PM   Specimen: BLOOD  Result Value Ref Range Status   Specimen Description BLOOD LEFT ANTECUBITAL  Final   Special Requests   Final    BOTTLES DRAWN AEROBIC AND ANAEROBIC Blood Culture results may not be optimal due to an excessive volume of blood received in culture bottles   Culture   Final    NO GROWTH 5 DAYS Performed at [facility], [address] [place], [place] [address]    Report Status 10/30/2022 FINAL  Final  Aerobic Culture w Gram Stain (superficial specimen)     Status: None   Collection Time: 10/27/22  9:05 AM   Specimen: Hand; Wound  Result Value Ref Range Status   Specimen Description   Final    HAND Performed at [facility], [address], [place], [place] [address]    Special Requests   Final    Normal Performed at [facility], [address] [place], [place] [address]    Gram Stain   Final    RARE WBC PRESENT,BOTH PMN AND MONONUCLEAR NO ORGANISMS SEEN    Culture   Final    NO GROWTH 2 DAYS Performed at [facility], [address], [place], [place] [address]    Report Status 10/30/2022 FINAL  Final  Aerobic/Anaerobic Culture w Gram Stain  (surgical/deep wound)     Status: None (Preliminary result)   Collection Time: 10/28/22 11:19 AM   Specimen: Path Tissue  Result Value Ref Range Status   Specimen Description ABSCESS  Final   Special Requests RIGHT THUMB WEB SPACE  Final   Gram Stain   Final    RARE WBC PRESENT,BOTH PMN AND MONONUCLEAR NO ORGANISMS SEEN    Culture   Final    NO GROWTH < 24 HOURS Performed at [facility], [address], [place], [place] [address]    Report Status PENDING  Incomplete    Labs: CBC: Recent Labs  Lab 10/24/22 1912 10/25/22 0534 10/26/22 0428 10/28/22 0404 10/30/22 0308  WBC 16.0* 17.6* 15.1* 10.2 7.2  NEUTROABS 11.5* 12.7*  --   --   --   HGB 12.1* 11.5* 11.2* 11.1* 11.4*  HCT 36.3* 34.4* 33.7* 32.6* 34.0*  MCV 65.5* 65.5* 65.9* 65.1* 65.6*  PLT 270 252 295 344 393   Basic Metabolic Panel: Recent Labs  Lab 10/24/22 1912 10/25/22 0534 10/27/22 0336 10/28/22 0404 10/30/22 0308  NA 129* 133*  --  131* 130*  K 4.2 3.7  --  4.3 4.5  CL 99 102  --  99 96*  CO2 22 25  --  26 26  GLUCOSE 563* 240*  --  429* 402*  BUN 12 7  --  19 18  CREATININE 0.89 0.75 0.85 0.76 0.79  CALCIUM 8.6* 8.4*  --  8.8* 9.3   Liver Function Tests: Recent Labs  Lab 10/24/22 1912  AST 16  ALT 20  ALKPHOS 98  BILITOT 0.6  PROT 6.4*  ALBUMIN 3.6   CBG: Recent Labs  Lab 10/29/22 1158 10/29/22 1621 10/29/22 2136 10/30/22 0756 10/30/22 1128  GLUCAP 164* 217* 364* 214* 208*    Discharge time spent: greater than 30 minutes.  Signed: Alford Highland, MD [facility] 10/30/2022

## 2022-10-30 NOTE — Discharge Instructions (Signed)
Keep wound covered until seen by Dr Martha Clan on Friday.

## 2022-10-30 NOTE — Plan of Care (Signed)
  Problem: Coping: Goal: Ability to adjust to condition or change in health will improve Outcome: Progressing   Problem: Nutritional: Goal: Maintenance of adequate nutrition will improve Outcome: Progressing Goal: Progress toward achieving an optimal weight will improve Outcome: Progressing   Problem: Skin Integrity: Goal: Risk for impaired skin integrity will decrease Outcome: Progressing   Problem: Activity: Goal: Risk for activity intolerance will decrease Outcome: Progressing   Problem: Pain Managment: Goal: General experience of comfort will improve Outcome: Progressing

## 2022-10-30 NOTE — Progress Notes (Signed)
Subjective:  POD #2 s/p I&D right hand   The patient reports right hand pain as mild.  Cultures from the OR so far are negative for growth.  White count is now within normal limits.  Objective:   VITALS:   Vitals:   10/29/22 0827 10/29/22 1501 10/29/22 2307 10/30/22 0848  BP: (!) 138/96 131/88 129/82 130/81  Pulse: 84 86 86 (!) 107  Resp:   18 16  Temp: 98.3 F (36.8 C) 98.4 F (36.9 C) 98.9 F (37.2 C) 97.7 F (36.5 C)  TempSrc:   Oral   SpO2: 100% 100% 100% 99%  Weight:      Height:        PHYSICAL EXAM: Right hand extremity: Patient remains neurovascular intact. Patient's Penrose drain was removed.  There is no significant drainage from patient's I&D site. The patient has improved digital range of motion.  LABS  Results for orders placed or performed during the hospital encounter of 10/24/22 (from the past 24 hour(s))  Glucose, capillary     Status: Abnormal   Collection Time: 10/29/22  4:21 PM  Result Value Ref Range   Glucose-Capillary 217 (H) 70 - 99 mg/dL  Glucose, capillary     Status: Abnormal   Collection Time: 10/29/22  9:36 PM  Result Value Ref Range   Glucose-Capillary 364 (H) 70 - 99 mg/dL  CBC     Status: Abnormal   Collection Time: 10/30/22  3:08 AM  Result Value Ref Range   WBC 7.2 4.0 - 10.5 K/uL   RBC 5.18 4.22 - 5.81 MIL/uL   Hemoglobin 11.4 (L) 13.0 - 17.0 g/dL   HCT 16.1 (L) 09.6 - 04.5 %   MCV 65.6 (L) 80.0 - 100.0 fL   MCH 22.0 (L) 26.0 - 34.0 pg   MCHC 33.5 30.0 - 36.0 g/dL   RDW 40.9 81.1 - 91.4 %   Platelets 393 150 - 400 K/uL   nRBC 0.0 0.0 - 0.2 %  Basic metabolic panel     Status: Abnormal   Collection Time: 10/30/22  3:08 AM  Result Value Ref Range   Sodium 130 (L) 135 - 145 mmol/L   Potassium 4.5 3.5 - 5.1 mmol/L   Chloride 96 (L) 98 - 111 mmol/L   CO2 26 22 - 32 mmol/L   Glucose, Bld 402 (H) 70 - 99 mg/dL   BUN 18 6 - 20 mg/dL   Creatinine, Ser 7.82 0.61 - 1.24 mg/dL   Calcium 9.3 8.9 - 95.6 mg/dL   GFR, Estimated >21  >30 mL/min   Anion gap 8 5 - 15  Glucose, capillary     Status: Abnormal   Collection Time: 10/30/22  7:56 AM  Result Value Ref Range   Glucose-Capillary 214 (H) 70 - 99 mg/dL  Glucose, capillary     Status: Abnormal   Collection Time: 10/30/22 11:28 AM  Result Value Ref Range   Glucose-Capillary 208 (H) 70 - 99 mg/dL    No results found.  Assessment/Plan: 2 Days Post-Op   Principal Problem:   Abscess of hand, right Active Problems:   Cellulitis of right hand   Uncontrolled type 2 diabetes mellitus with hyperglycemia, without long-term current use of insulin (HCC)   Essential hypertension   Hyperlipidemia   Eczema   Tenosynovitis of finger and hand   Depression  I personally change the patient's dressing today.  He will continue on p.o. doxycycline until follow-up with me in the office later this week.  Patient  will leave his dressing in place until follow-up in the office.  He will remain out of work until his return to the office.  Patient was encouraged to continue elevation of the right upper extremity until follow-up.    Gregory Gibson , MD 10/30/2022, 1:45 PM

## 2022-11-01 LAB — AEROBIC/ANAEROBIC CULTURE W GRAM STAIN (SURGICAL/DEEP WOUND)

## 2022-11-02 LAB — AEROBIC/ANAEROBIC CULTURE W GRAM STAIN (SURGICAL/DEEP WOUND)

## 2022-11-16 ENCOUNTER — Ambulatory Visit: Payer: Managed Care, Other (non HMO) | Admitting: Dietician

## 2023-03-10 ENCOUNTER — Emergency Department
Admission: EM | Admit: 2023-03-10 | Discharge: 2023-03-10 | Disposition: A | Discharge status: Home / Self Care | Payer: Managed Care, Other (non HMO) | Attending: Emergency Medicine | Admitting: Emergency Medicine

## 2023-03-10 ENCOUNTER — Other Ambulatory Visit: Payer: Self-pay

## 2023-03-10 ENCOUNTER — Emergency Department: Payer: Managed Care, Other (non HMO)

## 2023-03-10 DIAGNOSIS — Z794 Long term (current) use of insulin: Secondary | ICD-10-CM | POA: Diagnosis not present

## 2023-03-10 DIAGNOSIS — I10 Essential (primary) hypertension: Secondary | ICD-10-CM | POA: Diagnosis not present

## 2023-03-10 DIAGNOSIS — E119 Type 2 diabetes mellitus without complications: Secondary | ICD-10-CM | POA: Insufficient documentation

## 2023-03-10 DIAGNOSIS — M7122 Synovial cyst of popliteal space [Baker], left knee: Secondary | ICD-10-CM | POA: Insufficient documentation

## 2023-03-10 DIAGNOSIS — M25562 Pain in left knee: Secondary | ICD-10-CM | POA: Diagnosis present

## 2023-03-10 MED ORDER — IBUPROFEN 600 MG PO TABS
600.0000 mg | ORAL_TABLET | Freq: Once | ORAL | Status: AC
  Administered 2023-03-10: 600 mg via ORAL
  Filled 2023-03-10: qty 1

## 2023-03-10 MED ORDER — IBUPROFEN 600 MG PO TABS: 600.0000 mg | ORAL_TABLET | Freq: Four times a day (QID) | ORAL | 0 refills | Status: DC | PRN

## 2023-03-10 NOTE — ED Provider Notes (Signed)
[facility] Provider Note    Event Date/Time   First MD Initiated Contact with Patient 03/10/23 1149     (approximate)   History   Knee Pain   HPI  Gregory Gibson is a 47 y.o. male   presents to the ED with complaint of left knee pain for the last 2 days without known recent injury.  Patient states that his leg has been swelling and then yesterday noticed a knot to the back of his knee.  Patient reports it is increased pain with walking.  Patient has a history of diabetes and hypertension and currently is taking insulin.  Patient denies any recent injury, traveling, past history of DVT.      Physical Exam   Triage Vital Signs: ED Triage Vitals [03/10/23 1127]  Encounter Vitals Group     BP (!) 140/90     Systolic BP Percentile      Diastolic BP Percentile      Pulse Rate (!) 112     Resp 20     Temp (!) 97.5 F (36.4 C)     Temp Source Oral     SpO2 97 %     Weight      Height      Head Circumference      Peak Flow      Pain Score 6     Pain Loc      Pain Education      Exclude from Growth Chart     Most recent vital signs: Vitals:   03/10/23 1127  BP: (!) 140/90  Pulse: (!) 112  Resp: 20  Temp: (!) 97.5 F (36.4 C)  SpO2: 97%     General: Awake, no distress.  CV:  Good peripheral perfusion.  Resp:  Normal effort.  Abd:  No distention.  Other:  Patient with soft, tender palpable area posterior left knee.  No pitting edema noted lower extremity.  Skin is intact.  Patient is able to stand and ambulate without any assistance.   ED Results / Procedures / Treatments   Labs (all labs ordered are listed, but only abnormal results are displayed) Labs Reviewed - No data to display    RADIOLOGY  Venous ultrasound of lower extremity per radiologist is negative for DVT.  A Baker's cyst is noted.   PROCEDURES:  Critical Care performed:   Procedures   MEDICATIONS ORDERED IN ED: Medications  ibuprofen (ADVIL) tablet 600  mg (600 mg Oral Given 03/10/23 1518)     IMPRESSION / MDM / ASSESSMENT AND PLAN / ED COURSE  I reviewed the triage vital signs and the nursing notes.   Differential diagnosis includes, but is not limited to, DVT, left knee pain, degenerative joint disease, Baker's cyst.  47 year old male presents to the ED with complaint of left knee pain for 1 week and an area of swelling.  He also states that his entire leg has been swelling and painful.  Ultrasound of the lower extremity was reassuring and patient was made aware that he does not have a DVT but indeed does have a Baker's cyst.  A prescription for anti-inflammatories was sent to the pharmacy for him to begin taking and he is instructed to call make an appointment with orthopedics for evaluation and most likely removal of the Baker's cyst.      Patient's presentation is most consistent with acute complicated illness / injury requiring diagnostic workup.  FINAL CLINICAL IMPRESSION(S) / ED DIAGNOSES  Final diagnoses:  Baker's cyst of knee, left     Rx / DC Orders   ED Discharge Orders          Ordered    ibuprofen (ADVIL) 600 MG tablet  Every 6 hours PRN        03/10/23 1514             Note:  This document was prepared using Dragon voice recognition software and may include unintentional dictation errors.   Tommi Rumps, PA-C 03/10/23 6160    Jene Every, MD 03/10/23 [phone]

## 2023-03-10 NOTE — Discharge Instructions (Addendum)
Call [facility] orthopedic department to make an appointment with an orthopedist about your Baker's cyst.  A prescription for anti-inflammatories sent to the pharmacy for you to begin taking with food.  May also use ice as needed for pain.

## 2023-03-10 NOTE — ED Triage Notes (Signed)
Pt to ED via Pov from home. Pt ambulatory to triage. Pt reports posterior left knee pain x2 days. Pt presents with a knot to posterior knee. Pt denies injury.

## 2023-06-21 ENCOUNTER — Other Ambulatory Visit: Payer: Self-pay

## 2023-06-21 ENCOUNTER — Emergency Department: Payer: Managed Care, Other (non HMO)

## 2023-06-21 DIAGNOSIS — Y92000 Kitchen of unspecified non-institutional (private) residence as  the place of occurrence of the external cause: Secondary | ICD-10-CM | POA: Diagnosis not present

## 2023-06-21 DIAGNOSIS — S0990XA Unspecified injury of head, initial encounter: Secondary | ICD-10-CM | POA: Diagnosis present

## 2023-06-21 DIAGNOSIS — S01511A Laceration without foreign body of lip, initial encounter: Secondary | ICD-10-CM | POA: Insufficient documentation

## 2023-06-21 DIAGNOSIS — Z794 Long term (current) use of insulin: Secondary | ICD-10-CM | POA: Diagnosis not present

## 2023-06-21 DIAGNOSIS — S0121XA Laceration without foreign body of nose, initial encounter: Secondary | ICD-10-CM | POA: Diagnosis not present

## 2023-06-21 DIAGNOSIS — Z79899 Other long term (current) drug therapy: Secondary | ICD-10-CM | POA: Insufficient documentation

## 2023-06-21 DIAGNOSIS — E119 Type 2 diabetes mellitus without complications: Secondary | ICD-10-CM | POA: Diagnosis not present

## 2023-06-21 DIAGNOSIS — I10 Essential (primary) hypertension: Secondary | ICD-10-CM | POA: Insufficient documentation

## 2023-06-21 DIAGNOSIS — W01198A Fall on same level from slipping, tripping and stumbling with subsequent striking against other object, initial encounter: Secondary | ICD-10-CM | POA: Insufficient documentation

## 2023-06-21 NOTE — ED Triage Notes (Signed)
 Pt arrives via pov ambulatory to triage following a mechanical fall. Sts he hit the corner of the table. Presents with laceration to left upper lip and right nose. No loose teeth. Sts he doesn't know if he lost consciousness.

## 2023-06-21 NOTE — ED Notes (Signed)
 Discussed with pt edp Derrill Kay. Verbal order for CT imaging obtained

## 2023-06-22 ENCOUNTER — Emergency Department
Admission: EM | Admit: 2023-06-22 | Discharge: 2023-06-22 | Disposition: A | Discharge status: Home / Self Care | Payer: Managed Care, Other (non HMO) | Attending: Emergency Medicine | Admitting: Emergency Medicine

## 2023-06-22 DIAGNOSIS — M899 Disorder of bone, unspecified: Secondary | ICD-10-CM

## 2023-06-22 DIAGNOSIS — S01511A Laceration without foreign body of lip, initial encounter: Secondary | ICD-10-CM

## 2023-06-22 DIAGNOSIS — W19XXXA Unspecified fall, initial encounter: Secondary | ICD-10-CM

## 2023-06-22 DIAGNOSIS — S0990XA Unspecified injury of head, initial encounter: Secondary | ICD-10-CM

## 2023-06-22 MED ORDER — ONDANSETRON 4 MG PO TBDP: 4.0000 mg | ORAL_TABLET | Freq: Four times a day (QID) | ORAL | 0 refills | Status: AC | PRN

## 2023-06-22 MED ORDER — AMOXICILLIN-POT CLAVULANATE 875-125 MG PO TABS
1.0000 | ORAL_TABLET | Freq: Once | ORAL | Status: AC
  Administered 2023-06-22: 1 via ORAL
  Filled 2023-06-22: qty 1

## 2023-06-22 MED ORDER — OXYCODONE-ACETAMINOPHEN 5-325 MG PO TABS: 2.0000 | ORAL_TABLET | Freq: Three times a day (TID) | ORAL | 0 refills | Status: DC | PRN

## 2023-06-22 MED ORDER — OXYCODONE-ACETAMINOPHEN 5-325 MG PO TABS
2.0000 | ORAL_TABLET | Freq: Once | ORAL | Status: AC
  Administered 2023-06-22: 2 via ORAL
  Filled 2023-06-22: qty 2

## 2023-06-22 MED ORDER — CHLORHEXIDINE GLUCONATE 0.12 % MT SOLN: 15.0000 mL | Freq: Two times a day (BID) | OROMUCOSAL | 0 refills | Status: AC

## 2023-06-22 MED ORDER — AMOXICILLIN-POT CLAVULANATE 875-125 MG PO TABS: 1.0000 | ORAL_TABLET | Freq: Two times a day (BID) | ORAL | 0 refills | Status: DC

## 2023-06-22 MED ORDER — LIDOCAINE HCL (PF) 1 % IJ SOLN
10.0000 mL | Freq: Once | INTRAMUSCULAR | Status: AC
  Administered 2023-06-22: 10 mL via INTRADERMAL
  Filled 2023-06-22: qty 10

## 2023-06-22 MED ORDER — ONDANSETRON 4 MG PO TBDP
4.0000 mg | ORAL_TABLET | Freq: Once | ORAL | Status: AC
  Administered 2023-06-22: 4 mg via ORAL
  Filled 2023-06-22: qty 1

## 2023-06-22 MED ORDER — IBUPROFEN 800 MG PO TABS: 800.0000 mg | ORAL_TABLET | Freq: Three times a day (TID) | ORAL | 0 refills | Status: AC | PRN

## 2023-06-22 MED ORDER — BACITRACIN ZINC 500 UNIT/GM EX OINT
TOPICAL_OINTMENT | Freq: Once | CUTANEOUS | Status: DC
  Filled 2023-06-22: qty 2.7

## 2023-06-22 NOTE — ED Provider Notes (Signed)
 [facility] Provider Note    Event Date/Time   First MD Initiated Contact with Patient 06/22/23 0214     (approximate)   History   Fall and Facial Laceration   HPI  Gregory Gibson is a 48 y.o. male history of hypertension, insulin -dependent diabetes, hyperlipidemia who presents to the emergency department after he fell in his kitchen.  States he thinks he lost his balance and hit his face on the stove.  No loss of consciousness.  He is not on blood thinners.  No neck or back pain.  Has a laceration just underneath the alla of the right nose that extends into the nostril and a macerated through and through laceration to the left upper lip.  He has some dental pain but no loose teeth.  States his tetanus vaccine has been within the past year.   History provided by patient, wife.    Past Medical History:  Diagnosis Date   Diabetes mellitus without complication (HCC)    Hypertension     Past Surgical History:  Procedure Laterality Date   APPENDECTOMY     I & D EXTREMITY Right 10/28/2022   Procedure: IRRIGATION AND DEBRIDEMENT RIGHT HAND;  Surgeon: Marchia Drivers, MD;  Location: [facility] ORS;  Service: Orthopedics;  Laterality: Right;    MEDICATIONS:  Prior to Admission medications   Medication Sig Start Date End Date Taking? Authorizing Provider  atorvastatin  (LIPITOR) 20 MG tablet Take 20 mg by mouth daily. 06/06/22   [provider]  Blood Glucose Monitoring Suppl DEVI 1 each by Does not apply route 3 (three) times daily. May dispense any manufacturer covered by patient's insurance. 10/30/22   Josette Ade, MD  clobetasol cream (TEMOVATE) 0.05 % Apply 1 Application topically 2 (two) times a week. 10/10/22   [provider]  fenofibrate  (TRICOR ) 145 MG tablet Take 145 mg by mouth daily. 06/06/22   [provider]  Glucose Blood (BLOOD GLUCOSE TEST STRIPS) STRP 1 each by Does not apply route 3 (three) times daily. Use as  directed to check blood sugar. May dispense any manufacturer covered by patient's insurance and fits patient's device. 10/30/22   Josette Ade, MD  ibuprofen  (ADVIL ) 600 MG tablet Take 1 tablet (600 mg total) by mouth every 6 (six) hours as needed. With food 03/10/23   Saunders Shona CROME, PA-C  insulin  glargine-yfgn (SEMGLEE ) 100 UNIT/ML Pen Inject 32 Units into the skin at bedtime. May substitute as needed per insurance. 10/30/22   Josette Ade, MD  insulin  lispro (HUMALOG ) 100 UNIT/ML KwikPen Inject 0-21 Units into the skin 3 (three) times daily with meals. If eating and Blood Glucose (BG) 80 or higher inject 10 units for meal coverage and add correction dose per scale. If not eating, correction dose only. BG <150= 0 unit; BG 150-200= 1 unit; BG 201-250= 3 unit; BG 251-300= 5 unit; BG 301-350= 7 unit; BG 351-400= 9 unit; BG >400= 11 unit and Call Primary Care. 10/30/22   Josette Ade, MD  Insulin  Pen Needle (BD PEN NEEDLE NANO U/F) 32G X 4 MM MISC 1 Dose by Does not apply route 3 (three) times daily. May dispense any manufacturer covered by patient's insurance. 10/30/22   Josette Ade, MD  Lancet Device MISC 1 each by Does not apply route 3 (three) times daily. May dispense any manufacturer covered by patient's insurance. 10/30/22   Josette Ade, MD  Lancets MISC 1 each by Does not apply route 3 (three) times daily. Use as  directed to check blood sugar. May dispense any manufacturer covered by patient's insurance and fits patient's device. 10/30/22   Josette Ade, MD  lisinopril  (ZESTRIL ) 10 MG tablet Take 10 mg by mouth daily.    [provider]  sertraline  (ZOLOFT ) 50 MG tablet Take 50 mg by mouth daily. 06/06/22   [provider]  triamcinolone cream (KENALOG) 0.1 % PLEASE SEE ATTACHED FOR DETAILED DIRECTIONS 10/08/22   [provider]    Physical Exam   Triage Vital Signs: ED Triage Vitals  Encounter Vitals Group     BP 06/21/23 2227 (!) 139/114      Systolic BP Percentile --      Diastolic BP Percentile --      Pulse Rate 06/21/23 2227 (!) 102     Resp 06/21/23 2227 19     Temp 06/21/23 2227 98 F (36.7 C)     Temp src --      SpO2 06/21/23 2227 100 %     Weight 06/21/23 2227 185 lb (83.9 kg)     Height 06/21/23 2227 5' 11 (1.803 m)     Head Circumference --      Peak Flow --      Pain Score 06/21/23 2227 10     Pain Loc --      Pain Education --      Exclude from Growth Chart --     Most recent vital signs: Vitals:   06/22/23 0230 06/22/23 0423  BP: (!) 162/97 (!) 158/84  Pulse: (!) 104 98  Resp: 16 18  Temp: 98.2 F (36.8 C) 98.3 F (36.8 C)  SpO2: 100% 100%     CONSTITUTIONAL: Alert, responds appropriately to questions. Well-appearing; well-nourished; GCS 15 HEAD: Normocephalic; patient has a 4 cm laceration just underneath the lower cartilage in all of the right side of the nose that extends into the nostril.  He also has a 3 cm through and through macerated laceration to the left upper lip that does cross the vermilion border EYES: Conjunctivae clear, PERRL, EOMI ENT: normal nose; no rhinorrhea; moist mucous membranes; pharynx without lesions noted; no dental injury; no septal hematoma, no epistaxis; no facial deformity or bony tenderness NECK: Supple, no midline spinal tenderness, step-off or deformity; trachea midline CARD: RRR; S1 and S2 appreciated; no murmurs, no clicks, no rubs, no gallops RESP: Normal chest excursion without splinting or tachypnea; breath sounds clear and equal bilaterally; no wheezes, no rhonchi, no rales; no hypoxia or respiratory distress CHEST:  chest wall stable, no crepitus or ecchymosis or deformity, nontender to palpation; no flail chest ABD/GI: Non-distended; soft, non-tender, no rebound, no guarding; no ecchymosis or other lesions noted PELVIS:  stable, nontender to palpation BACK:  The back appears normal; no midline spinal tenderness, step-off or deformity EXT: Normal ROM in all  joints; no edema; normal capillary refill; no cyanosis, no bony tenderness or bony deformity of patient's extremities, no joint effusions, compartments are soft, extremities are warm and well-perfused, no ecchymosis SKIN: Normal color for age and race; warm NEURO: No facial asymmetry, normal speech, moving all extremities equally  ED Results / Procedures / Treatments   LABS: (all labs ordered are listed, but only abnormal results are displayed) Labs Reviewed - No data to display   EKG:    RADIOLOGY: My personal review and interpretation of imaging: CT head, face and cervical spine show acute minimally displaced femur fracture.  I have personally reviewed all radiology reports. CT Head Wo Contrast Result  Date: 06/21/2023 CLINICAL DATA:  fall, facial trauma EXAM: CT HEAD WITHOUT CONTRAST CT MAXILLOFACIAL WITHOUT CONTRAST CT CERVICAL SPINE WITHOUT CONTRAST TECHNIQUE: Multidetector CT imaging of the head, cervical spine, and maxillofacial structures were performed using the standard protocol without intravenous contrast. Multiplanar CT image reconstructions of the cervical spine and maxillofacial structures were also generated. RADIATION DOSE REDUCTION: This exam was performed according to the departmental dose-optimization program which includes automated exposure control, adjustment of the mA and/or kV according to patient size and/or use of iterative reconstruction technique. COMPARISON:  None Available. FINDINGS: CT HEAD FINDINGS Brain: No evidence of large-territorial acute infarction. No parenchymal hemorrhage. No mass lesion. No extra-axial collection. No mass effect or midline shift. No hydrocephalus. Basilar cisterns are patent. Vascular: No hyperdense vessel. Skull: No acute fracture or focal lesion. Other: Left occipital parietal scalp dermal thickening (5:56, 6:40). CT MAXILLOFACIAL FINDINGS Osseous: Acute minimally displaced vomer fracture. No destructive process. Sinuses/Orbits:  Paranasal sinuses and mastoid air cells are clear. The orbits are unremarkable. Soft tissues: Marked mandibular soft tissue defect. CT CERVICAL SPINE FINDINGS Alignment: Reversal of the normal cervical lordosis centered at the C3-C5 levels likely due to degenerative changes. Skull base and vertebrae: Multilevel moderate degenerative changes spine. Associated severe osseous neuroforaminal stenosis of the bilateral C5-C6 and right C7-T1. No acute fracture. No aggressive appearing focal osseous lesion or focal pathologic process. Soft tissues and spinal canal: Trauma Upper chest: Unremarkable. Other: None. IMPRESSION: 1. No acute intracranial abnormality. 2. Acute minimally displaced vomer fracture. 3. No acute displaced fracture or traumatic listhesis of the cervical spine. 4. Left occipital parietal scalp dermal thickening. Correlate with physical exam. Electronically Signed   By: Morgane  Naveau M.D.   On: 06/21/2023 23:22   CT Maxillofacial Wo Contrast Result Date: 06/21/2023 CLINICAL DATA:  fall, facial trauma EXAM: CT HEAD WITHOUT CONTRAST CT MAXILLOFACIAL WITHOUT CONTRAST CT CERVICAL SPINE WITHOUT CONTRAST TECHNIQUE: Multidetector CT imaging of the head, cervical spine, and maxillofacial structures were performed using the standard protocol without intravenous contrast. Multiplanar CT image reconstructions of the cervical spine and maxillofacial structures were also generated. RADIATION DOSE REDUCTION: This exam was performed according to the departmental dose-optimization program which includes automated exposure control, adjustment of the mA and/or kV according to patient size and/or use of iterative reconstruction technique. COMPARISON:  None Available. FINDINGS: CT HEAD FINDINGS Brain: No evidence of large-territorial acute infarction. No parenchymal hemorrhage. No mass lesion. No extra-axial collection. No mass effect or midline shift. No hydrocephalus. Basilar cisterns are patent. Vascular: No hyperdense  vessel. Skull: No acute fracture or focal lesion. Other: Left occipital parietal scalp dermal thickening (5:56, 6:40). CT MAXILLOFACIAL FINDINGS Osseous: Acute minimally displaced vomer fracture. No destructive process. Sinuses/Orbits: Paranasal sinuses and mastoid air cells are clear. The orbits are unremarkable. Soft tissues: Marked mandibular soft tissue defect. CT CERVICAL SPINE FINDINGS Alignment: Reversal of the normal cervical lordosis centered at the C3-C5 levels likely due to degenerative changes. Skull base and vertebrae: Multilevel moderate degenerative changes spine. Associated severe osseous neuroforaminal stenosis of the bilateral C5-C6 and right C7-T1. No acute fracture. No aggressive appearing focal osseous lesion or focal pathologic process. Soft tissues and spinal canal: Trauma Upper chest: Unremarkable. Other: None. IMPRESSION: 1. No acute intracranial abnormality. 2. Acute minimally displaced vomer fracture. 3. No acute displaced fracture or traumatic listhesis of the cervical spine. 4. Left occipital parietal scalp dermal thickening. Correlate with physical exam. Electronically Signed   By: Morgane  Naveau M.D.   On: 06/21/2023 23:22   CT  Cervical Spine Wo Contrast Result Date: 06/21/2023 CLINICAL DATA:  fall, facial trauma EXAM: CT HEAD WITHOUT CONTRAST CT MAXILLOFACIAL WITHOUT CONTRAST CT CERVICAL SPINE WITHOUT CONTRAST TECHNIQUE: Multidetector CT imaging of the head, cervical spine, and maxillofacial structures were performed using the standard protocol without intravenous contrast. Multiplanar CT image reconstructions of the cervical spine and maxillofacial structures were also generated. RADIATION DOSE REDUCTION: This exam was performed according to the departmental dose-optimization program which includes automated exposure control, adjustment of the mA and/or kV according to patient size and/or use of iterative reconstruction technique. COMPARISON:  None Available. FINDINGS: CT HEAD  FINDINGS Brain: No evidence of large-territorial acute infarction. No parenchymal hemorrhage. No mass lesion. No extra-axial collection. No mass effect or midline shift. No hydrocephalus. Basilar cisterns are patent. Vascular: No hyperdense vessel. Skull: No acute fracture or focal lesion. Other: Left occipital parietal scalp dermal thickening (5:56, 6:40). CT MAXILLOFACIAL FINDINGS Osseous: Acute minimally displaced vomer fracture. No destructive process. Sinuses/Orbits: Paranasal sinuses and mastoid air cells are clear. The orbits are unremarkable. Soft tissues: Marked mandibular soft tissue defect. CT CERVICAL SPINE FINDINGS Alignment: Reversal of the normal cervical lordosis centered at the C3-C5 levels likely due to degenerative changes. Skull base and vertebrae: Multilevel moderate degenerative changes spine. Associated severe osseous neuroforaminal stenosis of the bilateral C5-C6 and right C7-T1. No acute fracture. No aggressive appearing focal osseous lesion or focal pathologic process. Soft tissues and spinal canal: Trauma Upper chest: Unremarkable. Other: None. IMPRESSION: 1. No acute intracranial abnormality. 2. Acute minimally displaced vomer fracture. 3. No acute displaced fracture or traumatic listhesis of the cervical spine. 4. Left occipital parietal scalp dermal thickening. Correlate with physical exam. Electronically Signed   By: Morgane  Naveau M.D.   On: 06/21/2023 23:22     PROCEDURES:  Critical Care performed: No  LACERATION REPAIR Performed by: Josette Hiro Vipond Authorized by: Josette Kasiah Manka Consent: Verbal consent obtained. Risks and benefits: risks, benefits and alternatives were discussed Consent given by: patient Patient identity confirmed: provided demographic data Prepped and Draped in normal sterile fashion Wound explored  Laceration Location: Underneath the right side of his nose and left upper lip  Laceration Length: 4 cm, 3 cm  No Foreign Bodies seen or  palpated  Anesthesia: local infiltration  Local anesthetic: lidocaine  1% without epinephrine  Anesthetic total: 10 ml  Irrigation method: syringe Amount of cleaning: Extensive  Skin closure: Complex  Number of sutures: 5; 12  Technique: Area anesthetized using lidocaine  1% without epinephrine. Wound irrigated copiously with sterile saline. Wound then cleaned with Betadine and draped in sterile fashion. Wound closed using a total of 17 simple interrupted sutures with 4-0 Vicryl. Good wound approximation and hemostasis achieved.    Patient tolerance: Patient tolerated the procedure well with no immediate complications.    Procedures    IMPRESSION / MDM / ASSESSMENT AND PLAN / ED COURSE  I reviewed the triage vital signs and the nursing notes.  Patient here with complex facial lacerations after a mechanical fall.     DIFFERENTIAL DIAGNOSIS (includes but not limited to): Facial lacerations, facial fracture, intracranial hemorrhage, cervical spine fracture  Patient's presentation is most consistent with acute presentation with potential threat to life or bodily function.  PLAN: CT scans obtained from triage reviewed/interpreted by myself and the radiologist.  He has a minimally displaced femur fracture but no other acute abnormality.  Given the extent of his lip laceration and that he is a diabetic, will start him on antibiotics.  His tetanus vaccine is  up-to-date.  Will give pain medication here and repair his laceration.   MEDICATIONS GIVEN IN ED: Medications  bacitracin  ointment (0 Applications Topical Hold 06/22/23 0221)  lidocaine  (PF) (XYLOCAINE ) 1 % injection 10 mL (10 mLs Intradermal Given by Other 06/22/23 0227)  amoxicillin -clavulanate (AUGMENTIN ) 875-125 MG per tablet 1 tablet (1 tablet Oral Given 06/22/23 0226)  oxyCODONE -acetaminophen  (PERCOCET/ROXICET) 5-325 MG per tablet 2 tablet (2 tablets Oral Given 06/22/23 0226)  ondansetron  (ZOFRAN -ODT) disintegrating tablet 4 mg  (4 mg Oral Given 06/22/23 0226)     ED COURSE: Laceration underneath the lower cartilage and all of the right side of the nose came together very well with simple interrupted sutures.  I did place 1 suture within the nostril.  The laceration to his lip came together well at the most superior portion but down towards the bottom of the lip where the skin was very macerated, repair was difficult.  We discussed the option of removing a large chunk that was hanging off versus trying to suture it back on.  I discussed with patient that I am concerned if we cut this off that this will leave a large defect in his lip that may need surgical intervention later.  Patient and wife are agreeable to try to have this piece sutured back with the rest of the lip.  We did discuss the concern that this tissue may not be viable.  The lip came together much with good approximation and hemostasis.  He was able to drink out of a cup afterwards.  His pain is well-controlled.  He also has a small superficial laceration to the left lower lip.  Have offered repair of this as well but patient declines.  He is aware that he will need to follow-up with ENT to ensure that the lip laceration heals appropriately.  Recommended soft diet for the next 3 days, antibiotics, Peridex .  Will discharge with pain medication as well.  Patient and wife comfortable with this plan.  Discussed at length return precautions.   At this time, I do not feel there is any life-threatening condition present. I reviewed all nursing notes, vitals, pertinent previous records.  All lab and urine results, EKGs, imaging ordered have been independently reviewed and interpreted by myself.  I reviewed all available radiology reports from any imaging ordered this visit.  Based on my assessment, I feel the patient is safe to be discharged home without further emergent workup and can continue workup as an outpatient as needed. Discussed all findings, treatment plan as well as  usual and customary return precautions.  They verbalize understanding and are comfortable with this plan.  Outpatient follow-up has been provided as needed.  All questions have been answered.    CONSULTS:  none   OUTSIDE RECORDS REVIEWED: Reviewed last internal medicine note in March 2022.       FINAL CLINICAL IMPRESSION(S) / ED DIAGNOSES   Final diagnoses:  Fall, initial encounter  Injury of head, initial encounter  Lip laceration, initial encounter  Disorder of vomer     Rx / DC Orders   ED Discharge Orders          Ordered    amoxicillin -clavulanate (AUGMENTIN ) 875-125 MG tablet  2 times daily        06/22/23 0412    oxyCODONE -acetaminophen  (PERCOCET) 5-325 MG tablet  Every 8 hours PRN        06/22/23 0412    ondansetron  (ZOFRAN -ODT) 4 MG disintegrating tablet  Every 6 hours PRN  06/22/23 0412    ibuprofen  (ADVIL ) 800 MG tablet  Every 8 hours PRN        06/22/23 0412    chlorhexidine  (PERIDEX ) 0.12 % solution  2 times daily        06/22/23 9587             Note:  This document was prepared using Dragon voice recognition software and may include unintentional dictation errors.   Willis Holquin, Josette SAILOR, DO 06/22/23 [id number]

## 2023-06-22 NOTE — Discharge Instructions (Addendum)
 You are being provided a prescription for opiates (also known as narcotics) for pain control.  Opiates can be addictive and should only be used when absolutely necessary for pain control when other alternatives do not work.  We recommend you only use them for the recommended amount of time and only as prescribed.  Please do not take with other sedative medications or alcohol.  Please do not drive, operate machinery, make important decisions while taking opiates.  Please note that these medications can be addictive and have high abuse potential.  Patients can become addicted to narcotics after only taking them for a few days.  Please keep these medications locked away from children, teenagers or any family members with history of substance abuse.  Narcotic pain medicine may also make you constipated.  You may use over-the-counter medications such as MiraLAX, Colace to prevent constipation.  If you become constipated, you may use over-the-counter enemas as needed.  Itching and nausea are also common side effects of narcotic pain medication.  If you develop uncontrolled vomiting or a rash, please stop these medications and seek medical care.   Please take your antibiotics until complete.  Please follow-up closely with ENT to ensure appropriate healing of the laceration to your mouth.  I recommend a soft diet for the next 3 days.  After eating, I recommend rinsing your mouth with water.
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
## 2023-10-09 ENCOUNTER — Emergency Department
Admission: EM | Admit: 2023-10-09 | Discharge: 2023-10-09 | Disposition: A | Discharge status: Home / Self Care | Attending: Emergency Medicine | Admitting: Emergency Medicine

## 2023-10-09 ENCOUNTER — Other Ambulatory Visit: Payer: Self-pay

## 2023-10-09 DIAGNOSIS — E1165 Type 2 diabetes mellitus with hyperglycemia: Secondary | ICD-10-CM | POA: Insufficient documentation

## 2023-10-09 DIAGNOSIS — I10 Essential (primary) hypertension: Secondary | ICD-10-CM | POA: Diagnosis not present

## 2023-10-09 DIAGNOSIS — R42 Dizziness and giddiness: Secondary | ICD-10-CM | POA: Diagnosis present

## 2023-10-09 DIAGNOSIS — E860 Dehydration: Secondary | ICD-10-CM | POA: Diagnosis not present

## 2023-10-09 DIAGNOSIS — R739 Hyperglycemia, unspecified: Secondary | ICD-10-CM

## 2023-10-09 DIAGNOSIS — L03114 Cellulitis of left upper limb: Secondary | ICD-10-CM

## 2023-10-09 LAB — URINALYSIS, ROUTINE W REFLEX MICROSCOPIC
Bacteria, UA: NONE SEEN
Bilirubin Urine: NEGATIVE
Glucose, UA: >=500 mg/dL — AB
Hgb urine dipstick: NEGATIVE
Ketones, ur: 20 mg/dL — AB
Leukocytes,Ua: NEGATIVE
Nitrite: NEGATIVE
Protein, ur: NEGATIVE mg/dL
RBC / HPF: 0 RBC/hpf (ref 0–5)
Specific Gravity, Urine: 1.03 (ref 1.005–1.030)
pH: 5 (ref 5.0–8.0)

## 2023-10-09 LAB — BASIC METABOLIC PANEL WITH GFR
Anion gap: 12 (ref 5–15)
BUN: 15 mg/dL (ref 6–20)
CO2: 22 mmol/L (ref 22–32)
Calcium: 9.2 mg/dL (ref 8.9–10.3)
Chloride: 96 mmol/L — ABNORMAL LOW (ref 98–111)
Creatinine, Ser: 1 mg/dL (ref 0.61–1.24)
GFR, Estimated: >60 mL/min (ref >60)
Glucose, Bld: 472 mg/dL — ABNORMAL HIGH (ref 70–99)
Potassium: 4 mmol/L (ref 3.5–5.1)
Sodium: 130 mmol/L — ABNORMAL LOW (ref 135–145)

## 2023-10-09 LAB — CBC
HCT: 35.6 % — ABNORMAL LOW (ref 39.0–52.0)
Hemoglobin: 11.9 g/dL — ABNORMAL LOW (ref 13.0–17.0)
MCH: 22.3 pg — ABNORMAL LOW (ref 26.0–34.0)
MCHC: 33.4 g/dL (ref 30.0–36.0)
MCV: 66.7 fL — ABNORMAL LOW (ref 80.0–100.0)
Platelets: 276 10*3/uL (ref 150–400)
RBC: 5.34 MIL/uL (ref 4.22–5.81)
RDW: 14.4 % (ref 11.5–15.5)
WBC: 7.7 10*3/uL (ref 4.0–10.5)
nRBC: 0 % (ref 0.0–0.2)

## 2023-10-09 LAB — CBG MONITORING, ED
Glucose-Capillary: 458 mg/dL — ABNORMAL HIGH (ref 70–99)
Glucose-Capillary: 223 mg/dL — ABNORMAL HIGH (ref 70–99)

## 2023-10-09 MED ORDER — LISINOPRIL 10 MG PO TABS: 10.0000 mg | ORAL_TABLET | Freq: Every day | ORAL | 2 refills | Status: AC

## 2023-10-09 MED ORDER — INSULIN ASPART 100 UNIT/ML IJ SOLN
10.0000 [IU] | Freq: Once | INTRAMUSCULAR | Status: AC
  Administered 2023-10-09: 10 [IU] via INTRAVENOUS
  Filled 2023-10-09: qty 1

## 2023-10-09 MED ORDER — SULFAMETHOXAZOLE-TRIMETHOPRIM 800-160 MG PO TABS: 1.0000 | ORAL_TABLET | Freq: Two times a day (BID) | ORAL | 0 refills | Status: DC

## 2023-10-09 MED ORDER — SODIUM CHLORIDE 0.9 % IV BOLUS
1000.0000 mL | Freq: Once | INTRAVENOUS | Status: AC
  Administered 2023-10-09: 1000 mL via INTRAVENOUS

## 2023-10-09 MED ORDER — ONDANSETRON HCL 4 MG/2ML IJ SOLN
4.0000 mg | Freq: Once | INTRAMUSCULAR | Status: AC
  Administered 2023-10-09: 4 mg via INTRAVENOUS
  Filled 2023-10-09: qty 2

## 2023-10-09 MED ORDER — KETOROLAC TROMETHAMINE 15 MG/ML IJ SOLN
15.0000 mg | Freq: Once | INTRAMUSCULAR | Status: AC
  Administered 2023-10-09: 15 mg via INTRAVENOUS
  Filled 2023-10-09: qty 1

## 2023-10-09 MED ORDER — SULFAMETHOXAZOLE-TRIMETHOPRIM 800-160 MG PO TABS
1.0000 | ORAL_TABLET | Freq: Once | ORAL | Status: AC
  Administered 2023-10-09: 1 via ORAL
  Filled 2023-10-09: qty 1

## 2023-10-09 MED ORDER — DEXCOM G7 SENSOR MISC: 1.0000 [IU] | Freq: Three times a day (TID) | 1 refills | Status: AC

## 2023-10-09 MED ORDER — BD PEN NEEDLE NANO U/F 32G X 4 MM MISC: 1.0000 | Freq: Three times a day (TID) | 1 refills | Status: AC

## 2023-10-09 NOTE — ED Notes (Signed)
CBG 223 

## 2023-10-09 NOTE — Discharge Instructions (Addendum)
 If interested in getting established with Endocrinology (Diabetes Specialist), ask your primary care provider for a referral to: [facility] [address] [place], [place], [place] [address] Phone: [phone]  I have sent prescriptions to your pharmacy for you to pick up with changes made by the diabetic coordinator.  I have also sent the antibiotic to the pharmacy to help treat concerns of infection in your hands.  Please follow-up with your dermatology team and primary care provider as needed.  Please return for any severe worsening symptoms regarding the possible infection in your hand if they have not improved within 3 days.

## 2023-10-09 NOTE — ED Triage Notes (Signed)
 Pt to ED via POV from home. Pt ambulatory to triage. Pt reports dizziness, hand swelling/pain/blisters x2-3 days. Pt reports increased urination. Pt with hx of DM. Was on pill but recently switched to insulin  to get sugar more under control.

## 2023-10-09 NOTE — ED Provider Notes (Signed)
 [facility] Provider Note    Event Date/Time   First MD Initiated Contact with Patient 10/09/23 1151     (approximate)   History   Dizziness   HPI Gregory Gibson is a 48 y.o. male with history of DM 2, HTN presenting today for lightheadedness.  Patient states over the past day he has had some lightheaded symptoms when he stands up as well as increased urination.  Tends to happen when his blood sugar gets high.  Denies fully passing out ever.  No chest pain, nausea, vomiting, shortness of breath, abdominal pain.  No dysuria.  Separately, also has chronic eczema to bilateral hands and has noted some cracking and drainage from the sites as well.  Having mild pain in his left hand with some redness noted.     Physical Exam   Triage Vital Signs: ED Triage Vitals  Encounter Vitals Group     BP 10/09/23 1122 110/76     Systolic BP Percentile --      Diastolic BP Percentile --      Pulse Rate 10/09/23 1122 (!) 103     Resp 10/09/23 1122 20     Temp 10/09/23 1122 98.1 F (36.7 C)     Temp Source 10/09/23 1122 Oral     SpO2 10/09/23 1122 98 %     Weight --      Height --      Head Circumference --      Peak Flow --      Pain Score 10/09/23 1121 9     Pain Loc --      Pain Education --      Exclude from Growth Chart --     Most recent vital signs: Vitals:   10/09/23 1122 10/09/23 1200  BP: 110/76 (!) 131/92  Pulse: (!) 103 87  Resp: 20 (!) 21  Temp: 98.1 F (36.7 C)   SpO2: 98% 100%   Physical Exam: I have reviewed the vital signs and nursing notes. General: Awake, alert, no acute distress.  Nontoxic appearing. Head:  Atraumatic, normocephalic.   ENT:  EOM intact, PERRL. Oral mucosa is pink and moist with no lesions. Neck: Neck is supple with full range of motion, No meningeal signs. Cardiovascular:  RRR, No murmurs. Peripheral pulses palpable and equal bilaterally. Respiratory:  Symmetrical chest wall expansion.  No rhonchi, rales, or  wheezes.  Good air movement throughout.  No use of accessory muscles.   Musculoskeletal:  No cyanosis or edema. Moving extremities with full ROM Abdomen:  Soft, nontender, nondistended. Neuro:  GCS 15, moving all four extremities, interacting appropriately. Speech clear. Psych:  Calm, appropriate.   Skin: Eczema to bilateral hands with cracking and some drainage of clear fluid.  Slight erythema noted to left hand with no significant swelling   ED Results / Procedures / Treatments   Labs (all labs ordered are listed, but only abnormal results are displayed) Labs Reviewed  BASIC METABOLIC PANEL WITH GFR - Abnormal; Notable for the following components:      Result Value   Sodium 130 (*)    Chloride 96 (*)    Glucose, Bld 472 (*)    All other components within normal limits  CBC - Abnormal; Notable for the following components:   Hemoglobin 11.9 (*)    HCT 35.6 (*)    MCV 66.7 (*)    MCH 22.3 (*)    All other components within normal limits  URINALYSIS, ROUTINE W  REFLEX MICROSCOPIC - Abnormal; Notable for the following components:   Color, Urine STRAW (*)    APPearance CLEAR (*)    Glucose, UA >=500 (*)    Ketones, ur 20 (*)    All other components within normal limits  CBG MONITORING, ED - Abnormal; Notable for the following components:   Glucose-Capillary 458 (*)    All other components within normal limits  CBG MONITORING, ED - Abnormal; Notable for the following components:   Glucose-Capillary 223 (*)    All other components within normal limits     EKG My EKG interpretation: Rate of 101, sinus tachycardia, normal axis, normal intervals.  No acute ST elevations or depressions   RADIOLOGY    PROCEDURES:  Critical Care performed: No  Procedures   MEDICATIONS ORDERED IN ED: Medications  ketorolac  (TORADOL ) 15 MG/ML injection 15 mg (15 mg Intravenous Given 10/09/23 1223)  sodium chloride  0.9 % bolus 1,000 mL (0 mLs Intravenous Stopped 10/09/23 1313)  insulin   aspart (novoLOG ) injection 10 Units (10 Units Intravenous Given 10/09/23 1224)  sulfamethoxazole -trimethoprim  (BACTRIM  DS) 800-160 MG per tablet 1 tablet (1 tablet Oral Given 10/09/23 1223)  ondansetron  (ZOFRAN ) injection 4 mg (4 mg Intravenous Given 10/09/23 1229)     IMPRESSION / MDM / ASSESSMENT AND PLAN / ED COURSE  I reviewed the triage vital signs and the nursing notes.                              Differential diagnosis includes, but is not limited to, hyperglycemia, DKA, HHS, dehydration, AKI, electrolyte abnormality, cellulitis  Patient's presentation is most consistent with acute complicated illness / injury requiring diagnostic workup.  Patient is a 48 year old male presenting today for lightheadedness with dysuria symptoms.  History of diabetes and on arrival was found to have elevated blood sugar at 458.  Thankfully BMP does not show any signs of DKA.  No concerns for HHS.  No anion gap or acidosis present.  Will treat with 1 L fluids as well as 10 units insulin  and recheck blood sugar.  Make sure he can tolerate p.o.  Separately, he is also having chronic eczema symptoms of his hands with some cracking and concern for possible cellulitis.  Vital signs otherwise stable and no leukocytosis at this time.  I do have concern for cellulitis though in his hands and will start on Bactrim  here but do not think he requires IV antibiotics at this time.  Patient had improvement in blood sugar to 223 following fluids and insulin .  Tolerating p.o. without difficulty.  No ongoing lightheaded symptoms.  He was also seen by the diabetic coordinator and will give refills for his Dexcom sensor and insulin  pen needles.  Also given prescription for his blood pressure medication.  Otherwise safe for discharge.  Sent out on Bactrim  for cellulitis in his hand and given strict return precautions.  The patient is on the cardiac monitor to evaluate for evidence of arrhythmia and/or significant heart rate  changes. Clinical Course as of 10/09/23 1324  Tue Oct 09, 2023  1317 Repeat BG is 223. Will discharge at this time [DW]    Clinical Course User Index [DW] Kandee Orion, MD     FINAL CLINICAL IMPRESSION(S) / ED DIAGNOSES   Final diagnoses:  Hyperglycemia  Dehydration  Cellulitis of left upper extremity     Rx / DC Orders   ED Discharge Orders  Ordered    sulfamethoxazole -trimethoprim  (BACTRIM  DS) 800-160 MG tablet  2 times daily        10/09/23 1324    Continuous Glucose Sensor (DEXCOM G7 SENSOR) MISC  3 times daily with meals        10/09/23 1324    Insulin  Pen Needle (BD PEN NEEDLE NANO U/F) 32G X 4 MM MISC  3 times daily        10/09/23 1324    lisinopril  (ZESTRIL ) 10 MG tablet  Daily        10/09/23 1324             Note:  This document was prepared using Dragon voice recognition software and may include unintentional dictation errors.   Kandee Orion, MD 10/09/23 1325

## 2023-10-09 NOTE — Inpatient Diabetes Management (Addendum)
 Inpatient Diabetes Program Recommendations  AACE/ADA: New Consensus Statement on Inpatient Glycemic Control   Target Ranges:  Prepandial:   less than 140 mg/dL      Peak postprandial:   less than 180 mg/dL (1-2 hours)      Critically ill patients:  140 - 180 mg/dL    Latest Reference Range & Units 10/09/23 11:22  CO2 22 - 32 mmol/L 22  Glucose 70 - 99 mg/dL 161 (H)  BUN 6 - 20 mg/dL 15  Creatinine 0.96 - 0.45 mg/dL 4.09  Calcium  8.9 - 10.3 mg/dL 9.2  Anion gap 5 - 15  12    Latest Reference Range & Units 10/24/22 19:11  Hemoglobin A1C 4.8 - 5.6 % 11.8 (H)   Review of Glycemic Control  Diabetes history: DM2 Outpatient Diabetes medications: Semglee  22-25 units at bedtime, Humalog  15-20 units TID with meals Current orders for Inpatient glycemic control: None in ED currently  Inpatient Diabetes Program Recommendations:    Insulin : If patient is admitted, please consider ordering Semglee  25 units Q24H, CBGs AC&HS, Novolog  0-15 units AC&HS, and Novolog  5 units TID with meals for meal coverage if patient eats at least 50% of meals.  Outpatient DM:  At discharge, please provide Rx for Dexcom G7 sensors (#[id number]) and insulin  pen needles [id number]). Patient requested Rxs be [facility] on [address] in [place]. Patient also states he needs refill for his blood pressure medication.   NOTE: Per ED triage note today "Pt reports dizziness, hand swelling/pain/blisters x2-3 days. Pt reports increased urination. Pt with hx of DM. Was on pill but recently switched to insulin  to get sugar more under control."  Per chart review, patient does not have any insurance and goes to [facility] for primary care. Patient was inpatient 5/7-5/13/24 and at discharge on 10/30/22 - Glipizide , Jardiance, and Metformin  were discontinued and patient was prescribed Semglee  32 units at bedtime and Humalog  0-21 units TID with meals (10 units with meals plus correction).  Current lab glucose 472 mg/dl today at 21:30  am.    Addendum 10/09/23@13 :00-Spoke with patient at bedside regarding DM control. Patient reports that he is taking Semglee  22-25 units at bedtime and Humalog  15-20 units TID with meals, and Metformin  BID for DM control. Patient confirms that he goes to [facility] for primary care and was last seen in February 2025. Patient reports that he was using Dexcom G7 for CBG monitoring but the sensor came off about 4 days ago and he does not have any refills for the sensors. Patient reports that come off early sometimes and he ends up not being able to get refills before a certain time frame. Informed patient that if a sensor falls off every, he can contact Dexcom company and they will usually send a replacement sensor. Showed patient how he could use his Dexcom app on his phone to notify them as well. Provided patient with sample Dexcom G7 sensor.  Patient reports that his glucose is up and down. He states he tries to adjust the insulin  at night based on his glucose because he has had several issues with it going low during the night and having to get up and treat the low. Discussed Semglee  and Humalog  insulin  and how they work. Encouraged patient to not skip Semglee  insulin  at night and that he could use his Dexcom app to keep a record of the dose of insulin  he took. Explained that the provider needs to know what he is actually taken so they  can help him make adjustments with insulin  regimen if needed. Patient reports that his last A1C was in 10% range in February. Discussed glucose and A1C goals. Stressed to the patient the importance of improving glycemic control to prevent further complications from uncontrolled diabetes. Patient states he has plenty of Semglee , Humalog  and Metformin  at home. Patient reports he needs Rx for Dexcom G7 sensors, insulin  pen needles, and his blood pressure medication. Patient states he would [facility] [address] in [place]. Encouraged patient to consider asking PCP for  referral to Endocrinology for assistance with getting DM under better control.  Patient verbalized understanding of information discussed and reports no further questions at this time related to diabetes. Sent chat message to Dr. Karlynn Oyster to ask if patient is discharged from ED to provide Rx for Dexcom G7 sensors, insulin  pen needles, and blood pressure medication (patient request).   Thanks, Beacher Limerick, RN, MSN, CDCES Diabetes Coordinator Inpatient Diabetes Program [phone] (Team Pager from 8am to 5pm)

## 2023-10-15 ENCOUNTER — Inpatient Hospital Stay
Admission: EM | Admit: 2023-10-15 | Discharge: 2023-10-18 | DRG: 638 | Disposition: A | Discharge status: Home / Self Care | Attending: Internal Medicine | Admitting: Internal Medicine

## 2023-10-15 ENCOUNTER — Other Ambulatory Visit: Payer: Self-pay

## 2023-10-15 ENCOUNTER — Observation Stay

## 2023-10-15 DIAGNOSIS — M7989 Other specified soft tissue disorders: Secondary | ICD-10-CM | POA: Diagnosis not present

## 2023-10-15 DIAGNOSIS — Z7984 Long term (current) use of oral hypoglycemic drugs: Secondary | ICD-10-CM

## 2023-10-15 DIAGNOSIS — E785 Hyperlipidemia, unspecified: Secondary | ICD-10-CM | POA: Diagnosis present

## 2023-10-15 DIAGNOSIS — L02512 Cutaneous abscess of left hand: Secondary | ICD-10-CM

## 2023-10-15 DIAGNOSIS — F17210 Nicotine dependence, cigarettes, uncomplicated: Secondary | ICD-10-CM | POA: Diagnosis present

## 2023-10-15 DIAGNOSIS — F32A Depression, unspecified: Secondary | ICD-10-CM | POA: Diagnosis present

## 2023-10-15 DIAGNOSIS — Z794 Long term (current) use of insulin: Secondary | ICD-10-CM

## 2023-10-15 DIAGNOSIS — R600 Localized edema: Secondary | ICD-10-CM | POA: Diagnosis present

## 2023-10-15 DIAGNOSIS — Z1152 Encounter for screening for COVID-19: Secondary | ICD-10-CM

## 2023-10-15 DIAGNOSIS — E1165 Type 2 diabetes mellitus with hyperglycemia: Secondary | ICD-10-CM | POA: Diagnosis present

## 2023-10-15 DIAGNOSIS — L309 Dermatitis, unspecified: Secondary | ICD-10-CM | POA: Diagnosis present

## 2023-10-15 DIAGNOSIS — L03114 Cellulitis of left upper limb: Secondary | ICD-10-CM | POA: Diagnosis not present

## 2023-10-15 DIAGNOSIS — E11628 Type 2 diabetes mellitus with other skin complications: Secondary | ICD-10-CM | POA: Diagnosis not present

## 2023-10-15 DIAGNOSIS — E119 Type 2 diabetes mellitus without complications: Secondary | ICD-10-CM

## 2023-10-15 DIAGNOSIS — F17290 Nicotine dependence, other tobacco product, uncomplicated: Secondary | ICD-10-CM | POA: Diagnosis present

## 2023-10-15 DIAGNOSIS — Z8619 Personal history of other infectious and parasitic diseases: Secondary | ICD-10-CM

## 2023-10-15 DIAGNOSIS — Z79899 Other long term (current) drug therapy: Secondary | ICD-10-CM

## 2023-10-15 DIAGNOSIS — E871 Hypo-osmolality and hyponatremia: Secondary | ICD-10-CM | POA: Insufficient documentation

## 2023-10-15 DIAGNOSIS — L0390 Cellulitis, unspecified: Secondary | ICD-10-CM | POA: Diagnosis present

## 2023-10-15 DIAGNOSIS — L03119 Cellulitis of unspecified part of limb: Principal | ICD-10-CM

## 2023-10-15 DIAGNOSIS — Z91041 Radiographic dye allergy status: Secondary | ICD-10-CM

## 2023-10-15 DIAGNOSIS — I10 Essential (primary) hypertension: Secondary | ICD-10-CM | POA: Diagnosis present

## 2023-10-15 LAB — RESP PANEL BY RT-PCR (RSV, FLU A&B, COVID)  RVPGX2
Influenza A by PCR: NEGATIVE
Influenza B by PCR: NEGATIVE
Resp Syncytial Virus by PCR: NEGATIVE
SARS Coronavirus 2 by RT PCR: NEGATIVE

## 2023-10-15 LAB — CBC WITH DIFFERENTIAL/PLATELET
Abs Immature Granulocytes: 0.03 10*3/uL (ref 0.00–0.07)
Basophils Absolute: 0.1 10*3/uL (ref 0.0–0.1)
Basophils Relative: 1 %
Eosinophils Absolute: 0.1 10*3/uL (ref 0.0–0.5)
Eosinophils Relative: 1 %
HCT: 36.5 % — ABNORMAL LOW (ref 39.0–52.0)
Hemoglobin: 12.2 g/dL — ABNORMAL LOW (ref 13.0–17.0)
Immature Granulocytes: 0 %
Lymphocytes Relative: 8 %
Lymphs Abs: 0.8 10*3/uL (ref 0.7–4.0)
MCH: 22.1 pg — ABNORMAL LOW (ref 26.0–34.0)
MCHC: 33.4 g/dL (ref 30.0–36.0)
MCV: 66.1 fL — ABNORMAL LOW (ref 80.0–100.0)
Monocytes Absolute: 0.6 10*3/uL (ref 0.1–1.0)
Monocytes Relative: 6 %
Neutro Abs: 8.6 10*3/uL — ABNORMAL HIGH (ref 1.7–7.7)
Neutrophils Relative %: 84 %
Platelets: 341 10*3/uL (ref 150–400)
RBC: 5.52 MIL/uL (ref 4.22–5.81)
RDW: 14.2 % (ref 11.5–15.5)
Smear Review: NORMAL
WBC: 10.3 10*3/uL (ref 4.0–10.5)
nRBC: 0 % (ref 0.0–0.2)

## 2023-10-15 LAB — COMPREHENSIVE METABOLIC PANEL WITH GFR
ALT: 18 U/L (ref 0–44)
AST: 21 U/L (ref 15–41)
Albumin: 3.6 g/dL (ref 3.5–5.0)
Alkaline Phosphatase: 118 U/L (ref 38–126)
Anion gap: 11 (ref 5–15)
BUN: 19 mg/dL (ref 6–20)
CO2: 24 mmol/L (ref 22–32)
Calcium: 9.6 mg/dL (ref 8.9–10.3)
Chloride: 95 mmol/L — ABNORMAL LOW (ref 98–111)
Creatinine, Ser: 0.84 mg/dL (ref 0.61–1.24)
GFR, Estimated: >60 mL/min (ref >60)
Glucose, Bld: 378 mg/dL — ABNORMAL HIGH (ref 70–99)
Potassium: 4.1 mmol/L (ref 3.5–5.1)
Sodium: 130 mmol/L — ABNORMAL LOW (ref 135–145)
Total Bilirubin: 0.4 mg/dL (ref 0.0–1.2)
Total Protein: 7.5 g/dL (ref 6.5–8.1)

## 2023-10-15 LAB — LACTIC ACID, PLASMA
Lactic Acid, Venous: 2.3 mmol/L (ref 0.5–1.9)
Lactic Acid, Venous: 2.1 mmol/L (ref 0.5–1.9)

## 2023-10-15 LAB — GLUCOSE, CAPILLARY: Glucose-Capillary: 292 mg/dL — ABNORMAL HIGH (ref 70–99)

## 2023-10-15 MED ORDER — PRAVASTATIN SODIUM 20 MG PO TABS
10.0000 mg | ORAL_TABLET | Freq: Every day | ORAL | Status: DC
  Administered 2023-10-16 – 2023-10-17 (×2): 10 mg via ORAL
  Filled 2023-10-15 (×2): qty 1

## 2023-10-15 MED ORDER — VANCOMYCIN HCL 1750 MG/350ML IV SOLN
1750.0000 mg | Freq: Once | INTRAVENOUS | Status: AC
  Administered 2023-10-15: 1750 mg via INTRAVENOUS
  Filled 2023-10-15: qty 350

## 2023-10-15 MED ORDER — FENOFIBRATE 160 MG PO TABS
160.0000 mg | ORAL_TABLET | Freq: Every day | ORAL | Status: DC
  Administered 2023-10-16 – 2023-10-18 (×3): 160 mg via ORAL
  Filled 2023-10-15 (×3): qty 1

## 2023-10-15 MED ORDER — ACETAMINOPHEN 650 MG RE SUPP: 650.0000 mg | Freq: Four times a day (QID) | RECTAL | Status: DC | PRN

## 2023-10-15 MED ORDER — LISINOPRIL 10 MG PO TABS: 10.0000 mg | ORAL_TABLET | Freq: Every day | ORAL | Status: DC

## 2023-10-15 MED ORDER — ATORVASTATIN CALCIUM 20 MG PO TABS
20.0000 mg | ORAL_TABLET | Freq: Every day | ORAL | Status: DC
  Filled 2023-10-15: qty 1

## 2023-10-15 MED ORDER — ONDANSETRON HCL 4 MG/2ML IJ SOLN
4.0000 mg | Freq: Once | INTRAMUSCULAR | Status: AC
  Administered 2023-10-15: 4 mg via INTRAVENOUS
  Filled 2023-10-15: qty 2

## 2023-10-15 MED ORDER — HYDROMORPHONE HCL 1 MG/ML IJ SOLN: 0.5000 mg | Freq: Once | INTRAMUSCULAR | Status: DC

## 2023-10-15 MED ORDER — ONDANSETRON HCL 4 MG/2ML IJ SOLN
4.0000 mg | Freq: Four times a day (QID) | INTRAMUSCULAR | Status: DC | PRN
  Administered 2023-10-16: 4 mg via INTRAVENOUS
  Filled 2023-10-15: qty 2

## 2023-10-15 MED ORDER — VANCOMYCIN HCL IN DEXTROSE 1-5 GM/200ML-% IV SOLN: 1000.0000 mg | Freq: Once | INTRAVENOUS | Status: DC

## 2023-10-15 MED ORDER — LACTATED RINGERS IV BOLUS
1000.0000 mL | Freq: Once | INTRAVENOUS | Status: AC
  Administered 2023-10-15: 1000 mL via INTRAVENOUS

## 2023-10-15 MED ORDER — VANCOMYCIN HCL 1250 MG/250ML IV SOLN
1250.0000 mg | Freq: Two times a day (BID) | INTRAVENOUS | Status: DC
  Filled 2023-10-15: qty 250

## 2023-10-15 MED ORDER — SODIUM CHLORIDE 0.9 % IV SOLN
2.0000 g | Freq: Once | INTRAVENOUS | Status: DC
  Filled 2023-10-15: qty 20

## 2023-10-15 MED ORDER — ONDANSETRON HCL 4 MG PO TABS: 4.0000 mg | ORAL_TABLET | Freq: Four times a day (QID) | ORAL | Status: DC | PRN

## 2023-10-15 MED ORDER — INSULIN ASPART 100 UNIT/ML IJ SOLN
0.0000 [IU] | Freq: Every day | INTRAMUSCULAR | Status: DC
  Administered 2023-10-15: 3 [IU] via SUBCUTANEOUS
  Filled 2023-10-15: qty 1

## 2023-10-15 MED ORDER — SODIUM CHLORIDE 0.9 % IV SOLN: INTRAVENOUS | Status: DC

## 2023-10-15 MED ORDER — ENSURE ENLIVE PO LIQD
237.0000 mL | Freq: Two times a day (BID) | ORAL | Status: DC
  Administered 2023-10-16 – 2023-10-17 (×3): 237 mL via ORAL

## 2023-10-15 MED ORDER — HEPARIN SODIUM (PORCINE) 5000 UNIT/ML IJ SOLN
5000.0000 [IU] | Freq: Three times a day (TID) | INTRAMUSCULAR | Status: DC
  Administered 2023-10-16 – 2023-10-18 (×6): 5000 [IU] via SUBCUTANEOUS
  Filled 2023-10-15 (×6): qty 1

## 2023-10-15 MED ORDER — LISINOPRIL 10 MG PO TABS
10.0000 mg | ORAL_TABLET | Freq: Every day | ORAL | Status: DC
  Administered 2023-10-15 – 2023-10-18 (×4): 10 mg via ORAL
  Filled 2023-10-15 (×5): qty 1

## 2023-10-15 MED ORDER — MORPHINE SULFATE (PF) 4 MG/ML IV SOLN
4.0000 mg | INTRAVENOUS | Status: DC | PRN
  Administered 2023-10-15 – 2023-10-16 (×4): 4 mg via INTRAVENOUS
  Filled 2023-10-15 (×5): qty 1

## 2023-10-15 MED ORDER — SODIUM CHLORIDE 0.9 % IV SOLN
2.0000 g | Freq: Three times a day (TID) | INTRAVENOUS | Status: DC
  Administered 2023-10-15 – 2023-10-17 (×6): 2 g via INTRAVENOUS
  Filled 2023-10-15 (×7): qty 12.5

## 2023-10-15 MED ORDER — GADOBUTROL 1 MMOL/ML IV SOLN
6.0000 mL | Freq: Once | INTRAVENOUS | Status: AC | PRN
  Administered 2023-10-15: 6 mL via INTRAVENOUS

## 2023-10-15 MED ORDER — SERTRALINE HCL 50 MG PO TABS
50.0000 mg | ORAL_TABLET | Freq: Every day | ORAL | Status: DC
  Administered 2023-10-16 – 2023-10-18 (×3): 50 mg via ORAL
  Filled 2023-10-15 (×4): qty 1

## 2023-10-15 MED ORDER — MORPHINE SULFATE (PF) 2 MG/ML IV SOLN: 2.0000 mg | INTRAVENOUS | Status: DC | PRN

## 2023-10-15 MED ORDER — ACETAMINOPHEN 325 MG PO TABS: 650.0000 mg | ORAL_TABLET | Freq: Four times a day (QID) | ORAL | Status: DC | PRN

## 2023-10-15 MED ORDER — HYDRALAZINE HCL 20 MG/ML IJ SOLN: 5.0000 mg | Freq: Four times a day (QID) | INTRAMUSCULAR | Status: DC | PRN

## 2023-10-15 MED ORDER — HYDROMORPHONE HCL 1 MG/ML IJ SOLN
0.5000 mg | INTRAMUSCULAR | Status: DC | PRN
  Administered 2023-10-15 – 2023-10-16 (×4): 0.5 mg via INTRAVENOUS
  Filled 2023-10-15 (×4): qty 0.5

## 2023-10-15 MED ORDER — MELATONIN 5 MG PO TABS
5.0000 mg | ORAL_TABLET | Freq: Every evening | ORAL | Status: DC | PRN
  Administered 2023-10-15: 5 mg via ORAL
  Filled 2023-10-15: qty 1

## 2023-10-15 MED ORDER — INSULIN ASPART 100 UNIT/ML IJ SOLN
0.0000 [IU] | Freq: Three times a day (TID) | INTRAMUSCULAR | Status: DC
  Administered 2023-10-16: 3 [IU] via SUBCUTANEOUS
  Administered 2023-10-16: 2 [IU] via SUBCUTANEOUS
  Filled 2023-10-15 (×2): qty 1

## 2023-10-15 MED ORDER — INSULIN GLARGINE-YFGN 100 UNIT/ML ~~LOC~~ SOLN
32.0000 [IU] | Freq: Every day | SUBCUTANEOUS | Status: DC
  Administered 2023-10-15: 32 [IU] via SUBCUTANEOUS
  Filled 2023-10-15: qty 0.32

## 2023-10-15 MED ORDER — SENNOSIDES-DOCUSATE SODIUM 8.6-50 MG PO TABS: 1.0000 | ORAL_TABLET | Freq: Every evening | ORAL | Status: DC | PRN

## 2023-10-15 NOTE — Progress Notes (Signed)
 Pharmacy Antibiotic Note  Gregory Gibson is a 48 y.o. male admitted on 10/15/2023 with cellulitis.  Pharmacy has been consulted for vancomycin  dosing x 7 days.  Plan: Give vancomycin  1750 mg IV x1 followed by 1250 mg IV Q12H. Goal AUC 400-550. Expected AUC: 449.8 Expected Css min: 11.3 SCr used: 0.84  Weight used: IBW, Vd used: 0.72 (BMI 23.85) Start cefepime  2 g IV Q8H Continue to monitor renal function and follow culture results   Height: 5\' 11"  (180.3 cm) Weight: 77.6 kg (171 lb) IBW/kg (Calculated) : 75.3  Temp (24hrs), Avg:98.3 F (36.8 C), Min:98.3 F (36.8 C), Max:98.3 F (36.8 C)  Recent Labs  Lab 10/09/23 1122 10/15/23 1227  WBC 7.7 10.3  CREATININE 1.00 0.84  LATICACIDVEN  --  2.3*    Estimated Creatinine Clearance: 114.5 mL/min (by C-G formula based on SCr of 0.84 mg/dL).    Allergies  Allergen Reactions   Gadavist  [Gadobutrol ] Nausea And Vomiting    Patient vomits immediately  after injection    Antimicrobials this admission: 4/28 vanc >>  4/28 cefepime  >>   Microbiology results: None  Thank you for allowing pharmacy to be a part of this patient's care.  Alice Innocent, PharmD Clinical Pharmacist  10/15/2023 5:47 PM

## 2023-10-15 NOTE — Assessment & Plan Note (Signed)
 Home lisinopril  10 mg daily resumed Hydralazine 5 mg IV every 6 hours as needed for SBP greater 175, 5 day ordered

## 2023-10-15 NOTE — Assessment & Plan Note (Signed)
 Corrected serum sodium is 137

## 2023-10-15 NOTE — Hospital Course (Addendum)
 [name] is a 48 year old male with history of hypertension, hyperlipidemia, insulin -dependent diabetes mellitus, depression, who presents emergency department for chief concerns of left hand swelling.  Vitals in the ED showed temperature of 98.3, respiration rate 20, heart rate 108, blood pressure 138/72, SpO2 100% on room air.  Serum sodium is 130, potassium 4.1, chloride 95, bicarb 24, BUN of 19, serum creatinine of 2.84, EGFR greater than 60, nonfasting blood glucose 378, WBC 10.3, hemoglobin 12.2, platelets of 341.  AST, ALT, were within normal limits.  Lactic acid was elevated at 2.3.  ED treatment: Ceftriaxone  2 g IV one-time dose, vancomycin , LR 1 L bolus.  Patient was admitted for left hand cellulitis, MRI with concern of abscess s/p incision and drainage by orthopedic surgery.  Blood cultures remain negative.  Wound cultures with gram-positive cocci in pairs.  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  Patient received ceftriaxone  and vancomycin  while in the hospital and is being discharged on cefadroxil  for 1 week.  Patient should keep his dressing clean and will follow-up with orthopedic surgery on Tuesday for further recommendations.  1 dressing change was done before discharge.  Patient has uncontrolled diabetes with A1c of 13.1 and hyperglycemia.  That can increase the risk of infections.  Patient need better control of diabetes and a close follow-up with PCP.  Patient was given opioids to use for pain if Tylenol  or ibuprofen  does not work.  He was instructed to avoid constipation and it can cause dizziness.  Patient will continue the rest of his home medications and need to have a close follow-up with his providers for further recommendation

## 2023-10-15 NOTE — Assessment & Plan Note (Addendum)
 Initial lactic acid was elevated at 2.3 Blood cultures x 2 are in process Continue with vancomycin  per pharmacy Ordered cefepime  per pharmacy Status post LR 1 L bolus per EDP Continue with sodium chloride  infusion at 125 mL/h, 1 day ordered Symptomatic support: Morphine  2 mg IV every 4 hours as needed for moderate pain, morphine  4 mg IV every 4 hours as needed for severe pain, Dilaudid  0.5 mg IV every 4 hours as needed for severe pain, 20 hours of coverage ordered  MRI of the left hand with and without contrast ordered by EDP is pending at this time

## 2023-10-15 NOTE — ED Notes (Signed)
RN aware bed assigned ?

## 2023-10-15 NOTE — H&P (Addendum)
 History and Physical   Gregory Gibson ZOX:[id number] DOB: [birth year] DOA: 10/15/2023  PCP: [facility]  Patient coming from: Home  I have personally briefly reviewed patient's old medical records in [facility] EMR.  Chief Concern: Left hand swelling  HPI: [name] is a 48 year old male with history of hypertension, hyperlipidemia, insulin -dependent diabetes mellitus, depression, who presents emergency department for chief concerns of left hand swelling.  Vitals in the ED showed temperature of 98.3, respiration rate 20, heart rate 108, blood pressure 138/72, SpO2 100% on room air.  Serum sodium is 130, potassium 4.1, chloride 95, bicarb 24, BUN of 19, serum creatinine of 2.84, EGFR greater than 60, nonfasting blood glucose 378, WBC 10.3, hemoglobin 12.2, platelets of 341.  AST, ALT, were within normal limits.  Lactic acid was elevated at 2.3.  ED treatment: Ceftriaxone  2 g IV one-time dose, vancomycin , LR 1 L bolus. ------------------------------- At bedside, patient was able to tell me her first and last name, age, location, current calendar year.  He reports that his hand had swelling that started last week.  He denies bug bites, skin laceration, trauma to his hand.  He reports he was seen last week in the ED and was prescribed antibiotics which he has completed and when he noticed that the hand has not improved, he felt he needed to come in for further evaluation.  He endorses subjective fever however denies taking his temperature.  He reports he felt hot like he did today in the ED but his hand felt hotter.  Social history: He lives with his wife and kids at home.  He smokes about 3 cigarettes/day.  He denies EtOH and recreational drug use.  ROS: Constitutional: no weight change, no fever ENT/Mouth: no sore throat, no rhinorrhea Eyes: no eye pain, no vision changes Cardiovascular: no chest pain, no dyspnea,  no edema, no palpitations Respiratory: no  cough, no sputum, no wheezing Gastrointestinal: no nausea, no vomiting, no diarrhea, no constipation Genitourinary: no urinary incontinence, no dysuria, no hematuria Musculoskeletal: no arthralgias, no myalgias Skin: no skin lesions, no pruritus, redness of the left hand Neuro: + weakness, no loss of consciousness, no syncope Psych: no anxiety, no depression, + decrease appetite Heme/Lymph: no bruising, no bleeding  ED Course: Discussed with EDP, patient requiring hospitalization for chief concerns of cellulitis failing outpatient therapy.  Assessment/Plan  Principal Problem:   Cellulitis of left hand Active Problems:   Essential hypertension   Hyperlipidemia   Eczema   Depression   Insulin  dependent type 2 diabetes mellitus (HCC)   Hyperosmolar hyponatremia   Assessment and Plan:  * Cellulitis of left hand Initial lactic acid was elevated at 2.3 Blood cultures x 2 are in process Continue with vancomycin  per pharmacy Ordered cefepime  per pharmacy Status post LR 1 L bolus per EDP Continue with sodium chloride  infusion at 125 mL/h, 1 day ordered Symptomatic support: Morphine  2 mg IV every 4 hours as needed for moderate pain, morphine  4 mg IV every 4 hours as needed for severe pain, Dilaudid  0.5 mg IV every 4 hours as needed for severe pain, 20 hours of coverage ordered  MRI of the left hand with and without contrast ordered by EDP is pending at this time  Hyperosmolar hyponatremia Corrected serum sodium is 137  Insulin  dependent type 2 diabetes mellitus (HCC) Home long-acting insulin  32 units nightly resumed on admission Insulin  SSI with at bedtime coverage ordered Goal inpatient glucose levels 140-180  Depression Home sertraline  50 mg  daily resumed  Eczema Continue outpatient follow-up as appropriate  Hyperlipidemia Home lovastatin 10 mg nightly equivalent resumed on admission  Essential hypertension Home lisinopril  10 mg daily resumed Hydralazine 5 mg IV every 6  hours as needed for SBP greater 175, 5 day ordered  Chart reviewed.   DVT prophylaxis: Heparin 5000 units subcutaneous every 8 hours Code Status: Full code Diet: Heart healthy/carb modified Family Communication: Patient states his wife knows he is being admitted Disposition Plan: Pending clinical course, pending MRI Consults called: Pharmacy Admission status: Telemetry medical, observation  Past Medical History:  Diagnosis Date   Diabetes mellitus without complication (HCC)    Hypertension    Past Surgical History:  Procedure Laterality Date   APPENDECTOMY     I & D EXTREMITY Right 10/28/2022   Procedure: IRRIGATION AND DEBRIDEMENT RIGHT HAND;  Surgeon: Rande Bushy, MD;  Location: [facility] ORS;  Service: Orthopedics;  Laterality: Right;   Social History:  reports that he has been smoking cigarettes. He has never used smokeless tobacco. He reports current alcohol use of about 14.0 standard drinks of alcohol per week. He reports that he does not use drugs.  Allergies  Allergen Reactions   Gadavist  [Gadobutrol ] Nausea And Vomiting    Patient vomits immediately  after injection   History reviewed. No pertinent family history. Family history: Family history reviewed and not pertinent.  Prior to Admission medications   Medication Sig Start Date End Date Taking? Authorizing Provider  amoxicillin -clavulanate (AUGMENTIN ) 875-125 MG tablet Take 1 tablet by mouth 2 (two) times daily. 06/22/23   Ward, Clover Dao, DO  atorvastatin  (LIPITOR) 20 MG tablet Take 20 mg by mouth daily. 06/06/22   [provider]  Blood Glucose Monitoring Suppl DEVI 1 each by Does not apply route 3 (three) times daily. May dispense any manufacturer covered by patient's insurance. 10/30/22   Verla Glaze, MD  chlorhexidine  (PERIDEX ) 0.12 % solution Use as directed 15 mLs in the mouth or throat 2 (two) times daily. 06/22/23   Ward, Clover Dao, DO  clobetasol cream (TEMOVATE) 0.05 % Apply 1 Application  topically 2 (two) times a week. 10/10/22   [provider]  Continuous Glucose Sensor (DEXCOM G7 SENSOR) MISC 1 Units by Does not apply route with breakfast, with lunch, and with evening meal. 10/09/23   Kandee Orion, MD  fenofibrate  (TRICOR ) 145 MG tablet Take 145 mg by mouth daily. 06/06/22   [provider]  Glucose Blood (BLOOD GLUCOSE TEST STRIPS) STRP 1 each by Does not apply route 3 (three) times daily. Use as directed to check blood sugar. May dispense any manufacturer covered by patient's insurance and fits patient's device. 10/30/22   Verla Glaze, MD  ibuprofen  (ADVIL ) 800 MG tablet Take 1 tablet (800 mg total) by mouth every 8 (eight) hours as needed. 06/22/23   Ward, Clover Dao, DO  insulin  glargine-yfgn (SEMGLEE ) 100 UNIT/ML Pen Inject 32 Units into the skin at bedtime. May substitute as needed per insurance. 10/30/22   Verla Glaze, MD  insulin  lispro (HUMALOG ) 100 UNIT/ML KwikPen Inject 0-21 Units into the skin 3 (three) times daily with meals. If eating and Blood Glucose (BG) 80 or higher inject 10 units for meal coverage and add correction dose per scale. If not eating, correction dose only. BG <150= 0 unit; BG 150-200= 1 unit; BG 201-250= 3 unit; BG 251-300= 5 unit; BG 301-350= 7 unit; BG 351-400= 9 unit; BG >400= 11 unit and Call Primary Care. 10/30/22   Wieting, Richard,  MD  Insulin  Pen Needle (BD PEN NEEDLE NANO U/F) 32G X 4 MM MISC 1 Dose by Does not apply route 3 (three) times daily. May dispense any manufacturer covered by patient's insurance. 10/09/23   Kandee Orion, MD  Lancet Device MISC 1 each by Does not apply route 3 (three) times daily. May dispense any manufacturer covered by patient's insurance. 10/30/22   Verla Glaze, MD  Lancets MISC 1 each by Does not apply route 3 (three) times daily. Use as directed to check blood sugar. May dispense any manufacturer covered by patient's insurance and fits patient's device. 10/30/22   Verla Glaze, MD   lisinopril  (ZESTRIL ) 10 MG tablet Take 1 tablet (10 mg total) by mouth daily. 10/09/23 01/07/24  Kandee Orion, MD  ondansetron  (ZOFRAN -ODT) 4 MG disintegrating tablet Take 1 tablet (4 mg total) by mouth every 6 (six) hours as needed for nausea or vomiting. 06/22/23   Ward, Clover Dao, DO  oxyCODONE -acetaminophen  (PERCOCET) 5-325 MG tablet Take 2 tablets by mouth every 8 (eight) hours as needed. 06/22/23 06/21/24  Ward, Clover Dao, DO  sertraline  (ZOLOFT ) 50 MG tablet Take 50 mg by mouth daily. 06/06/22   [provider]  sulfamethoxazole -trimethoprim  (BACTRIM  DS) 800-160 MG tablet Take 1 tablet by mouth 2 (two) times daily for 7 days. 10/09/23 10/16/23  Kandee Orion, MD  triamcinolone cream (KENALOG) 0.1 % PLEASE SEE ATTACHED FOR DETAILED DIRECTIONS 10/08/22   [provider]    Physical Exam: Vitals:   10/15/23 1721 10/15/23 1900 10/15/23 1907 10/15/23 1942  BP:  (!) 163/118  (!) 171/118  Pulse:  98  (!) 102  Resp:  18  16  Temp:   98.3 F (36.8 C) 97.9 F (36.6 C)  TempSrc:   Oral Oral  SpO2:  100%  100%  Weight: 77.6 kg   69.6 kg  Height: 5\' 11"  (1.803 m)   5\' 11"  (1.803 m)   Constitutional: appears age-appropriate, NAD , calm Eyes: PERRL, lids and conjunctivae normal ENMT: Mucous membranes are moist. Posterior pharynx clear of any exudate or lesions. Age-appropriate dentition. Hearing appropriate Neck: normal, supple, no masses, no thyromegaly Respiratory: clear to auscultation bilaterally, no wheezing, no crackles. Normal respiratory effort. No accessory muscle use.  Cardiovascular: Regular rate and rhythm, no murmurs / rubs / gallops. No extremity edema. 2+ pedal pulses. No carotid bruits.  Abdomen: no tenderness, no masses palpated, no hepatosplenomegaly. Bowel sounds positive.  Musculoskeletal: no clubbing / cyanosis. No joint deformity upper and lower extremities. Good ROM, no contractures, no atrophy. Normal muscle tone.  Patient able to flex and extend his left hand  without difficulty. Skin:       No induration.  Bilateral upper extremity skin tattoos that appears well-healed Neurologic: Sensation intact. Strength 5/5 in all 4.  Psychiatric: Normal judgment and insight. Alert and oriented x 3. Normal mood.   EKG: independently reviewed, showing sinus tachycardia with rate of 110, QTc 424  Chest x-ray on Admission: Not indicated at this time  No results found.  Labs on Admission: I have personally reviewed following labs  CBC: Recent Labs  Lab 10/09/23 1122 10/15/23 1227  WBC 7.7 10.3  NEUTROABS  --  8.6*  HGB 11.9* 12.2*  HCT 35.6* 36.5*  MCV 66.7* 66.1*  PLT 276 341   Basic Metabolic Panel: Recent Labs  Lab 10/09/23 1122 10/15/23 1227  NA 130* 130*  K 4.0 4.1  CL 96* 95*  CO2 22 24  GLUCOSE 472* 378*  BUN 15 19  CREATININE 1.00 0.84  CALCIUM  9.2 9.6   GFR: Estimated Creatinine Clearance: 105.9 mL/min (by C-G formula based on SCr of 0.84 mg/dL).  Liver Function Tests: Recent Labs  Lab 10/15/23 1227  AST 21  ALT 18  ALKPHOS 118  BILITOT 0.4  PROT 7.5  ALBUMIN 3.6   CBG: Recent Labs  Lab 10/09/23 1126 10/09/23 1309 10/15/23 1942  GLUCAP 458* 223* 292*   Urine analysis:    Component Value Date/Time   COLORURINE STRAW (A) 10/09/2023 1122   APPEARANCEUR CLEAR (A) 10/09/2023 1122   LABSPEC 1.030 10/09/2023 1122   PHURINE 5.0 10/09/2023 1122   GLUCOSEU >=500 (A) 10/09/2023 1122   HGBUR NEGATIVE 10/09/2023 1122   BILIRUBINUR NEGATIVE 10/09/2023 1122   KETONESUR 20 (A) 10/09/2023 1122   PROTEINUR NEGATIVE 10/09/2023 1122   NITRITE NEGATIVE 10/09/2023 1122   LEUKOCYTESUR NEGATIVE 10/09/2023 1122   This document was prepared using Dragon Voice Recognition software and may include unintentional dictation errors.  Dr. Reinhold Carbine [facility]  If 7PM-7AM, please contact overnight-coverage provider If 7AM-7PM, please contact day attending provider [email or web address]  10/15/2023, 9:14 PM

## 2023-10-15 NOTE — Assessment & Plan Note (Addendum)
 Home lovastatin 10 mg nightly equivalent resumed on admission

## 2023-10-15 NOTE — Assessment & Plan Note (Addendum)
 Home long-acting insulin  32 units nightly resumed on admission Insulin  SSI with at bedtime coverage ordered Goal inpatient glucose levels 140-180

## 2023-10-15 NOTE — Assessment & Plan Note (Signed)
 Home sertraline 50 mg daily resumed

## 2023-10-15 NOTE — Assessment & Plan Note (Addendum)
-  Continue outpatient follow-up as appropriate

## 2023-10-15 NOTE — ED Provider Notes (Signed)
 [facility] Provider Note    Event Date/Time   First MD Initiated Contact with Patient 10/15/23 1655     (approximate)   History   Hand Pain   HPI  JAHEIR PAISLEY is a 48 y.o. male who comes in with concerns for left hand swelling and pain.  He reports this been going on for about 2 weeks.  Reports 10 out of 10 with pain.  He is got pain in the hand underneath the fifth digit.  Patient was just seen a few days ago placed on Bactrim  and had no significant resolution of symptoms continues to have severe pain.  He reports a history of having the similar thing happen on the right hand requiring debridement by orthopedics and admission for IV antibiotics.   Physical Exam   Triage Vital Signs: ED Triage Vitals  Encounter Vitals Group     BP 10/15/23 1507 138/72     Systolic BP Percentile --      Diastolic BP Percentile --      Pulse Rate 10/15/23 1507 (!) 108     Resp 10/15/23 1507 20     Temp 10/15/23 1507 98.3 F (36.8 C)     Temp Source 10/15/23 1507 Oral     SpO2 10/15/23 1507 100 %     Weight 10/15/23 1247 171 lb (77.6 kg)     Height 10/15/23 1247 5\' 11"  (1.803 m)     Head Circumference --      Peak Flow --      Pain Score 10/15/23 1247 10     Pain Loc --      Pain Education --      Exclude from Growth Chart --     Most recent vital signs: Vitals:   10/15/23 1507  BP: 138/72  Pulse: (!) 108  Resp: 20  Temp: 98.3 F (36.8 C)  SpO2: 100%     General: Awake, no distress.  CV:  Good peripheral perfusion.  Resp:  Normal effort.  Abd:  No distention.  Other:  Patient has some redness and warmth noted in the hand on the left side underneath the pinky.  He is able to flex and extend his fingers.   ED Results / Procedures / Treatments   Labs (all labs ordered are listed, but only abnormal results are displayed) Labs Reviewed  CBC WITH DIFFERENTIAL/PLATELET - Abnormal; Notable for the following components:      Result Value    Hemoglobin 12.2 (*)    HCT 36.5 (*)    MCV 66.1 (*)    MCH 22.1 (*)    Neutro Abs 8.6 (*)    All other components within normal limits  COMPREHENSIVE METABOLIC PANEL WITH GFR - Abnormal; Notable for the following components:   Sodium 130 (*)    Chloride 95 (*)    Glucose, Bld 378 (*)    All other components within normal limits  LACTIC ACID, PLASMA - Abnormal; Notable for the following components:   Lactic Acid, Venous 2.3 (*)    All other components within normal limits  RESP PANEL BY RT-PCR (RSV, FLU A&B, COVID)  [id number]  CULTURE, BLOOD (ROUTINE X 2)  CULTURE, BLOOD (ROUTINE X 2)  LACTIC ACID, PLASMA     EKG  My interpretation of EKG:  Sinus tachycardia rate of 110 without any ST elevation or T wave inversions, normal intervals  RADIOLOGY Pending  PROCEDURES:  Critical Care performed: No  Procedures   MEDICATIONS  ORDERED IN ED: Medications  cefTRIAXone  (ROCEPHIN ) 2 g in sodium chloride  0.9 % 100 mL IVPB (has no administration in time range)  vancomycin  (VANCOCIN ) IVPB 1000 mg/200 mL premix (has no administration in time range)  lactated ringers  bolus 1,000 mL (has no administration in time range)     IMPRESSION / MDM / ASSESSMENT AND PLAN / ED COURSE  I reviewed the triage vital signs and the nursing notes.   Patient's presentation is most consistent with acute presentation with potential threat to life or bodily function.   Patient comes in with tachycardia, hyperglycemia with concerns for hand infection.  Patient's failed outpatient treatment I do feel like patient would benefit from IV antibiotics given he does have what looks like cellulitis I will order MRI to ensure no abscess given he is had to had debridement previously.  I will discuss the hospital team for admission.  Palpitations labs do not meet sepsis criteria.  He does have evidence of hyperglycemia most likely from the infection but he does not have evidence of DKA.  I will discuss with the hospital  team for admission.  The patient is on the cardiac monitor to evaluate for evidence of arrhythmia and/or significant heart rate changes.      FINAL CLINICAL IMPRESSION(S) / ED DIAGNOSES   Final diagnoses:  Cellulitis of hand     Rx / DC Orders   ED Discharge Orders     None        Note:  This document was prepared using Dragon voice recognition software and may include unintentional dictation errors.   Lubertha Rush, MD 10/15/23 [phone]

## 2023-10-15 NOTE — ED Triage Notes (Signed)
 Patient comes in from home via POV with complaints of left hand swelling and pain. Pt states that the swelling in his hand has been going on about 2 weeks. Pt states that he was seen here a couple weeks ago as well and was treated with antibiotics. Pt complains of pain 10/10. PT has a history of diabetic, and hypertension. Pt is alert and oriented x4 with no signs of acute distress at this time.

## 2023-10-16 ENCOUNTER — Other Ambulatory Visit: Payer: Self-pay

## 2023-10-16 ENCOUNTER — Encounter
Admission: EM | Disposition: A | Discharge status: Home / Self Care | Payer: Self-pay | Source: Home / Self Care | Attending: Internal Medicine

## 2023-10-16 ENCOUNTER — Inpatient Hospital Stay: Admitting: Anesthesiology

## 2023-10-16 DIAGNOSIS — L309 Dermatitis, unspecified: Secondary | ICD-10-CM | POA: Diagnosis present

## 2023-10-16 DIAGNOSIS — Z1152 Encounter for screening for COVID-19: Secondary | ICD-10-CM | POA: Diagnosis not present

## 2023-10-16 DIAGNOSIS — I10 Essential (primary) hypertension: Secondary | ICD-10-CM | POA: Diagnosis present

## 2023-10-16 DIAGNOSIS — R600 Localized edema: Secondary | ICD-10-CM | POA: Diagnosis present

## 2023-10-16 DIAGNOSIS — Z79899 Other long term (current) drug therapy: Secondary | ICD-10-CM | POA: Diagnosis not present

## 2023-10-16 DIAGNOSIS — E785 Hyperlipidemia, unspecified: Secondary | ICD-10-CM | POA: Diagnosis present

## 2023-10-16 DIAGNOSIS — L02512 Cutaneous abscess of left hand: Secondary | ICD-10-CM | POA: Diagnosis present

## 2023-10-16 DIAGNOSIS — F17290 Nicotine dependence, other tobacco product, uncomplicated: Secondary | ICD-10-CM | POA: Diagnosis present

## 2023-10-16 DIAGNOSIS — E1165 Type 2 diabetes mellitus with hyperglycemia: Secondary | ICD-10-CM | POA: Diagnosis present

## 2023-10-16 DIAGNOSIS — E11628 Type 2 diabetes mellitus with other skin complications: Secondary | ICD-10-CM | POA: Diagnosis present

## 2023-10-16 DIAGNOSIS — F17210 Nicotine dependence, cigarettes, uncomplicated: Secondary | ICD-10-CM | POA: Diagnosis present

## 2023-10-16 DIAGNOSIS — Z8619 Personal history of other infectious and parasitic diseases: Secondary | ICD-10-CM | POA: Diagnosis not present

## 2023-10-16 DIAGNOSIS — L0390 Cellulitis, unspecified: Secondary | ICD-10-CM | POA: Diagnosis present

## 2023-10-16 DIAGNOSIS — E871 Hypo-osmolality and hyponatremia: Secondary | ICD-10-CM | POA: Diagnosis present

## 2023-10-16 DIAGNOSIS — Z794 Long term (current) use of insulin: Secondary | ICD-10-CM

## 2023-10-16 DIAGNOSIS — Z91041 Radiographic dye allergy status: Secondary | ICD-10-CM | POA: Diagnosis not present

## 2023-10-16 DIAGNOSIS — M7989 Other specified soft tissue disorders: Secondary | ICD-10-CM | POA: Diagnosis present

## 2023-10-16 DIAGNOSIS — F32A Depression, unspecified: Secondary | ICD-10-CM | POA: Diagnosis present

## 2023-10-16 DIAGNOSIS — Z7984 Long term (current) use of oral hypoglycemic drugs: Secondary | ICD-10-CM | POA: Diagnosis not present

## 2023-10-16 DIAGNOSIS — L03114 Cellulitis of left upper limb: Secondary | ICD-10-CM | POA: Diagnosis present

## 2023-10-16 HISTORY — PX: INCISION AND DRAINAGE OF DEEP ABSCESS, HAND: SHX7323

## 2023-10-16 LAB — CBC
HCT: 31 % — ABNORMAL LOW (ref 39.0–52.0)
Hemoglobin: 10.8 g/dL — ABNORMAL LOW (ref 13.0–17.0)
MCH: 22.4 pg — ABNORMAL LOW (ref 26.0–34.0)
MCHC: 34.8 g/dL (ref 30.0–36.0)
MCV: 64.2 fL — ABNORMAL LOW (ref 80.0–100.0)
Platelets: 298 10*3/uL (ref 150–400)
RBC: 4.83 MIL/uL (ref 4.22–5.81)
RDW: 13.9 % (ref 11.5–15.5)
WBC: 12.5 10*3/uL — ABNORMAL HIGH (ref 4.0–10.5)
nRBC: 0 % (ref 0.0–0.2)

## 2023-10-16 LAB — BASIC METABOLIC PANEL WITH GFR
Anion gap: 8 (ref 5–15)
BUN: 11 mg/dL (ref 6–20)
CO2: 22 mmol/L (ref 22–32)
Calcium: 8.8 mg/dL — ABNORMAL LOW (ref 8.9–10.3)
Chloride: 97 mmol/L — ABNORMAL LOW (ref 98–111)
Creatinine, Ser: 0.64 mg/dL (ref 0.61–1.24)
GFR, Estimated: >60 mL/min (ref >60)
Glucose, Bld: 245 mg/dL — ABNORMAL HIGH (ref 70–99)
Potassium: 3.7 mmol/L (ref 3.5–5.1)
Sodium: 127 mmol/L — ABNORMAL LOW (ref 135–145)

## 2023-10-16 LAB — HEMOGLOBIN A1C
Hgb A1c MFr Bld: 13.1 % — ABNORMAL HIGH (ref 4.8–5.6)
Mean Plasma Glucose: 329.27 mg/dL

## 2023-10-16 LAB — GLUCOSE, CAPILLARY
Glucose-Capillary: 401 mg/dL — ABNORMAL HIGH (ref 70–99)
Glucose-Capillary: 143 mg/dL — ABNORMAL HIGH (ref 70–99)
Glucose-Capillary: 173 mg/dL — ABNORMAL HIGH (ref 70–99)
Glucose-Capillary: 91 mg/dL (ref 70–99)
Glucose-Capillary: 91 mg/dL (ref 70–99)
Glucose-Capillary: 235 mg/dL — ABNORMAL HIGH (ref 70–99)

## 2023-10-16 SURGERY — INCISION AND DRAINAGE OF DEEP ABSCESS, HAND
Anesthesia: General | Laterality: Left

## 2023-10-16 MED ORDER — DEXMEDETOMIDINE HCL IN NACL 80 MCG/20ML IV SOLN
INTRAVENOUS | Status: DC | PRN
  Administered 2023-10-16: 8 ug via INTRAVENOUS
  Administered 2023-10-16: 12 ug via INTRAVENOUS

## 2023-10-16 MED ORDER — ONDANSETRON HCL 4 MG/2ML IJ SOLN
INTRAMUSCULAR | Status: DC | PRN
  Administered 2023-10-16: 4 mg via INTRAVENOUS

## 2023-10-16 MED ORDER — METOCLOPRAMIDE HCL 5 MG PO TABS: 5.0000 mg | ORAL_TABLET | Freq: Three times a day (TID) | ORAL | Status: DC | PRN

## 2023-10-16 MED ORDER — FENTANYL CITRATE (PF) 100 MCG/2ML IJ SOLN
25.0000 ug | INTRAMUSCULAR | Status: DC | PRN
  Administered 2023-10-16 (×2): 25 ug via INTRAVENOUS
  Administered 2023-10-16: 50 ug via INTRAVENOUS

## 2023-10-16 MED ORDER — LIDOCAINE HCL (PF) 2 % IJ SOLN
INTRAMUSCULAR | Status: AC
  Filled 2023-10-16: qty 5

## 2023-10-16 MED ORDER — MIDAZOLAM HCL 2 MG/2ML IJ SOLN
INTRAMUSCULAR | Status: AC
Start: 2023-10-16 — End: ?
  Filled 2023-10-16: qty 2

## 2023-10-16 MED ORDER — HYDROMORPHONE HCL 1 MG/ML IJ SOLN
0.5000 mg | INTRAMUSCULAR | Status: DC | PRN
  Administered 2023-10-16 – 2023-10-17 (×2): 0.5 mg via INTRAVENOUS
  Filled 2023-10-16 (×3): qty 0.5

## 2023-10-16 MED ORDER — VANCOMYCIN HCL 1.25 G IV SOLR
1250.0000 mg | Freq: Two times a day (BID) | INTRAVENOUS | Status: DC
  Administered 2023-10-16 – 2023-10-18 (×4): 1250 mg via INTRAVENOUS
  Filled 2023-10-16: qty 1250
  Filled 2023-10-16 (×4): qty 25

## 2023-10-16 MED ORDER — INSULIN GLARGINE-YFGN 100 UNIT/ML ~~LOC~~ SOLN
32.0000 [IU] | Freq: Every day | SUBCUTANEOUS | Status: DC
  Administered 2023-10-16 – 2023-10-17 (×2): 32 [IU] via SUBCUTANEOUS
  Filled 2023-10-16 (×2): qty 0.32

## 2023-10-16 MED ORDER — SODIUM CHLORIDE 0.9 % IV SOLN: INTRAVENOUS | Status: DC | PRN

## 2023-10-16 MED ORDER — DIPHENHYDRAMINE HCL 25 MG PO CAPS
25.0000 mg | ORAL_CAPSULE | Freq: Four times a day (QID) | ORAL | Status: DC | PRN
  Administered 2023-10-16 – 2023-10-18 (×3): 25 mg via ORAL
  Filled 2023-10-16 (×3): qty 1

## 2023-10-16 MED ORDER — CEFAZOLIN SODIUM-DEXTROSE 2-3 GM-%(50ML) IV SOLR
INTRAVENOUS | Status: DC | PRN
  Administered 2023-10-16: 2 g via INTRAVENOUS

## 2023-10-16 MED ORDER — DEXAMETHASONE SODIUM PHOSPHATE 10 MG/ML IJ SOLN
INTRAMUSCULAR | Status: DC | PRN
  Administered 2023-10-16: 5 mg via INTRAVENOUS

## 2023-10-16 MED ORDER — FLEET ENEMA RE ENEM: 1.0000 | ENEMA | Freq: Once | RECTAL | Status: DC | PRN

## 2023-10-16 MED ORDER — FENTANYL CITRATE (PF) 100 MCG/2ML IJ SOLN
INTRAMUSCULAR | Status: AC
  Filled 2023-10-16: qty 2

## 2023-10-16 MED ORDER — GLYCOPYRROLATE 0.2 MG/ML IJ SOLN
INTRAMUSCULAR | Status: DC | PRN
  Administered 2023-10-16 (×2): .1 mg via INTRAVENOUS

## 2023-10-16 MED ORDER — PROPOFOL 500 MG/50ML IV EMUL
INTRAVENOUS | Status: DC | PRN
  Administered 2023-10-16: 100 ug/kg/min via INTRAVENOUS

## 2023-10-16 MED ORDER — BISACODYL 10 MG RE SUPP: 10.0000 mg | Freq: Every day | RECTAL | Status: DC | PRN

## 2023-10-16 MED ORDER — PROPOFOL 1000 MG/100ML IV EMUL
INTRAVENOUS | Status: AC
  Filled 2023-10-16: qty 100

## 2023-10-16 MED ORDER — OXYCODONE HCL 5 MG/5ML PO SOLN: 5.0000 mg | Freq: Once | ORAL | Status: DC | PRN

## 2023-10-16 MED ORDER — FENTANYL CITRATE (PF) 100 MCG/2ML IJ SOLN
INTRAMUSCULAR | Status: DC | PRN
  Administered 2023-10-16 (×2): 50 ug via INTRAVENOUS

## 2023-10-16 MED ORDER — OXYCODONE HCL 5 MG PO TABS: 5.0000 mg | ORAL_TABLET | Freq: Once | ORAL | Status: DC | PRN

## 2023-10-16 MED ORDER — HYDROCODONE-ACETAMINOPHEN 5-325 MG PO TABS
1.0000 | ORAL_TABLET | ORAL | Status: DC | PRN
  Administered 2023-10-16 – 2023-10-17 (×2): 2 via ORAL
  Administered 2023-10-17: 1 via ORAL
  Administered 2023-10-17 – 2023-10-18 (×5): 2 via ORAL
  Filled 2023-10-16: qty 1
  Filled 2023-10-16 (×8): qty 2

## 2023-10-16 MED ORDER — ACETAMINOPHEN 500 MG PO TABS
500.0000 mg | ORAL_TABLET | Freq: Four times a day (QID) | ORAL | Status: AC
  Administered 2023-10-17: 500 mg via ORAL
  Filled 2023-10-16 (×2): qty 1

## 2023-10-16 MED ORDER — MAGNESIUM HYDROXIDE 400 MG/5ML PO SUSP: 30.0000 mL | Freq: Every day | ORAL | Status: DC | PRN

## 2023-10-16 MED ORDER — CEFAZOLIN SODIUM-DEXTROSE 2-4 GM/100ML-% IV SOLN
INTRAVENOUS | Status: AC
  Filled 2023-10-16: qty 100

## 2023-10-16 MED ORDER — METOPROLOL TARTRATE 5 MG/5ML IV SOLN
5.0000 mg | Freq: Three times a day (TID) | INTRAVENOUS | Status: DC
  Administered 2023-10-16: 5 mg via INTRAVENOUS
  Filled 2023-10-16 (×3): qty 5

## 2023-10-16 MED ORDER — METOCLOPRAMIDE HCL 5 MG/ML IJ SOLN: 5.0000 mg | Freq: Three times a day (TID) | INTRAMUSCULAR | Status: DC | PRN

## 2023-10-16 MED ORDER — PROPOFOL 10 MG/ML IV BOLUS
INTRAVENOUS | Status: DC | PRN
Start: 2023-10-16 — End: 2023-10-16
  Administered 2023-10-16: 60 mg via INTRAVENOUS

## 2023-10-16 MED ORDER — MORPHINE SULFATE (PF) 2 MG/ML IV SOLN
2.0000 mg | INTRAVENOUS | Status: DC | PRN
  Administered 2023-10-16 – 2023-10-18 (×5): 2 mg via INTRAVENOUS
  Filled 2023-10-16 (×6): qty 1

## 2023-10-16 MED ORDER — MIDAZOLAM HCL 2 MG/2ML IJ SOLN
INTRAMUSCULAR | Status: DC | PRN
  Administered 2023-10-16: 2 mg via INTRAVENOUS

## 2023-10-16 MED ORDER — ACETAMINOPHEN 10 MG/ML IV SOLN: 1000.0000 mg | Freq: Once | INTRAVENOUS | Status: DC | PRN

## 2023-10-16 MED ORDER — DOCUSATE SODIUM 100 MG PO CAPS
100.0000 mg | ORAL_CAPSULE | Freq: Two times a day (BID) | ORAL | Status: DC
  Administered 2023-10-16 – 2023-10-17 (×3): 100 mg via ORAL
  Filled 2023-10-16 (×4): qty 1

## 2023-10-16 MED ORDER — PHENYLEPHRINE 80 MCG/ML (10ML) SYRINGE FOR IV PUSH (FOR BLOOD PRESSURE SUPPORT)
PREFILLED_SYRINGE | INTRAVENOUS | Status: DC | PRN
  Administered 2023-10-16 (×2): 80 ug via INTRAVENOUS

## 2023-10-16 MED ORDER — LIDOCAINE HCL (PF) 2 % IJ SOLN
INTRAMUSCULAR | Status: DC | PRN
  Administered 2023-10-16: 60 mg via INTRADERMAL

## 2023-10-16 MED ORDER — VANCOMYCIN HCL 1.25 G IV SOLR
1250.0000 mg | Freq: Once | INTRAVENOUS | Status: AC
  Administered 2023-10-16: 1250 mg via INTRAVENOUS
  Filled 2023-10-16: qty 25

## 2023-10-16 MED ORDER — ONDANSETRON HCL 4 MG/2ML IJ SOLN: 4.0000 mg | Freq: Once | INTRAMUSCULAR | Status: DC | PRN

## 2023-10-16 SURGICAL SUPPLY — 38 items
BLADE SURG SZ10 CARB STEEL (BLADE) ×1 IMPLANT
BNDG COHESIVE 4X5 TAN STRL LF (GAUZE/BANDAGES/DRESSINGS) ×1 IMPLANT
BNDG ELASTIC 3X5.8 VLCR NS LF (GAUZE/BANDAGES/DRESSINGS) IMPLANT
BNDG ELASTIC 4INX 5YD STR LF (GAUZE/BANDAGES/DRESSINGS) ×1 IMPLANT
BNDG ESMARCH 4X12 STRL LF (GAUZE/BANDAGES/DRESSINGS) ×1 IMPLANT
BNDG GAUZE DERMACEA FLUFF 4 (GAUZE/BANDAGES/DRESSINGS) IMPLANT
CHLORAPREP W/TINT 26 (MISCELLANEOUS) ×1 IMPLANT
CNTNR URN SCR LID CUP LEK RST (MISCELLANEOUS) ×1 IMPLANT
CUFF TOURN SGL QUICK 18X4 (TOURNIQUET CUFF) IMPLANT
CUFF TRNQT CYL 24X4X16.5-23 (TOURNIQUET CUFF) IMPLANT
DRAIN PENROSE 12X.25 LTX STRL (MISCELLANEOUS) IMPLANT
ELECTRODE REM PT RTRN 9FT ADLT (ELECTROSURGICAL) ×1 IMPLANT
GAUZE SPONGE 4X4 12PLY STRL (GAUZE/BANDAGES/DRESSINGS) IMPLANT
GAUZE XEROFORM 1X8 LF (GAUZE/BANDAGES/DRESSINGS) IMPLANT
GLOVE BIO SURGEON STRL SZ8 (GLOVE) ×1 IMPLANT
GLOVE INDICATOR 8.0 STRL GRN (GLOVE) ×1 IMPLANT
GLOVE SURG ORTHO 8.5 STRL (GLOVE) ×1 IMPLANT
GOWN STRL REUS W/ TWL LRG LVL3 (GOWN DISPOSABLE) ×1 IMPLANT
GOWN STRL REUS W/ TWL XL LVL3 (GOWN DISPOSABLE) ×1 IMPLANT
IV CATH ANGIO 12GX3 LT BLUE (NEEDLE) IMPLANT
KIT TURNOVER KIT A (KITS) ×1 IMPLANT
LABEL OR SOLS (LABEL) ×1 IMPLANT
MANIFOLD NEPTUNE II (INSTRUMENTS) ×1 IMPLANT
NDL SAFETY ECLIPSE 18X1.5 (NEEDLE) ×1 IMPLANT
NS IRRIG 1000ML POUR BTL (IV SOLUTION) ×1 IMPLANT
PACK EXTREMITY ARMC (MISCELLANEOUS) ×1 IMPLANT
PAD CAST 4YDX4 CTTN HI CHSV (CAST SUPPLIES) ×1 IMPLANT
PADDING CAST BLEND 4X4 STRL (MISCELLANEOUS) ×1 IMPLANT
SPLINT CAST 1 STEP 3X12 (MISCELLANEOUS) ×1 IMPLANT
SPONGE T-LAP 18X18 ~~LOC~~+RFID (SPONGE) ×1 IMPLANT
STAPLER SKIN PROX 35W (STAPLE) ×1 IMPLANT
STOCKINETTE BIAS CUT 4 980044 (GAUZE/BANDAGES/DRESSINGS) ×1 IMPLANT
STOCKINETTE IMPERVIOUS 9X36 MD (GAUZE/BANDAGES/DRESSINGS) ×1 IMPLANT
SUT PROLENE 4 0 PS 2 18 (SUTURE) ×2 IMPLANT
SUTURE EHLN 3-0 FS-10 30 BLK (SUTURE) IMPLANT
SWAB CULTURE AMIES ANAERIB BLU (MISCELLANEOUS) ×1 IMPLANT
SYR 10ML LL (SYRINGE) ×1 IMPLANT
TRAP FLUID SMOKE EVACUATOR (MISCELLANEOUS) ×1 IMPLANT

## 2023-10-16 NOTE — Op Note (Signed)
 Patient Name: Gregory Gibson   MRN: [id number]   Pre-Operative Diagnosis: Left hand deep hypothenar abscess   Post-Operative Diagnosis: (same)   Procedure: Left hand incision and drainage with drain placement   Specimen: Cultures x 2   Date of Surgery: 10/16/2023   Surgeon: Venus Ginsberg MD   Assistant: None     Anesthesiologist: Girard Lam   Anesthesia: General    Tourniquet Time: 0 min   EBL: 10cc   IVF: 400cc   Complications: None     Brief history: The patient is a 48 year old male with a history left hand abscess which developed after scratching with no known trauma in the setting of poor glycemic control.  Under shared decision making model patient elected move forward with left hand incision and drainage with drain placement in the operating room.  All preoperative imaging was reviewed and appropriate surgical plan was made.       Description of procedure: The patient was brought to the operating room where laterality was confirmed by all those present to be the left side.    General anesthesia was administered.  Patient is positioned supine on the operating room table with all bony prominences well-padded and a hand table to the left side of the table.    The hand and forearm were then prepped and draped in normal sterile fashion using chlorhexidine  surgical prep.  Multiple layers of adhesive nonadhesive drapes were used to isolate the hand/arm.   Surgical timeout was performed and everyone in the operating room confirmed that the left side was the correct side.  Antibiotics were held until after cultures would be obtained.  A Bruner style the shaped incision was made over the mid hypothenar eminence at the center of maximal fluctuance careful to cross the cardinal lines of the hand at oblique angles.  A 15 blade was used to make this incision just through the skin with no deep penetration.  Blunt dissection was then carried out utilizing a snap to carefully spread the  tissues deep to the skin.  Immediately upon entering through the dermis gross purulence was encountered which was green-tinged and yellow.  Incision was carefully opened bluntly and 2 culture swabs were taken deep within the abscess and passed off the field.  Antibiotics was then given by anesthesia.  Blunt dissection utilizing hemostat and finger the loculations within the abscess were carefully broken up and both proximally and distally within the hypothenar eminence.  The area was irrigated with copious saline.  A digital exam was then again performed making sure to find any loculations. No sharp dissection or bipolar electrocautery was utilized deep to the dermis.  After the area was washed and cleaned with saline a Angiocath was used to washout any deep corners of the wound.  There was no more purulence found after washing with copious saline.  All bleeding vessels were coagulated utilizing bipolar cautery and the skin was closed utilizing 3-0 interrupted nylon sutures and a Penrose drain was placed at the distal aspect of the incision which was not sewn in.  There was good capillary refill to the tips of all fingers. A well-padded dry dressing with gauze, xeroform and ace wrap was then placed. Sponge, needle, and Lap counts were all correct at the end of the case.    The patient was transferred out of the operating room. good pulses were found distally on the operative side.  The patient was transferred to the recovery room in stable condition.

## 2023-10-16 NOTE — Anesthesia Postprocedure Evaluation (Signed)
 Anesthesia Post Note  Patient: Gregory Gibson  Procedure(s) Performed: INCISION AND DRAINAGE OF DEEP ABSCESS, HAND (Left)  Patient location during evaluation: PACU Anesthesia Type: General Level of consciousness: awake and alert Pain management: pain level controlled Vital Signs Assessment: post-procedure vital signs reviewed and stable Respiratory status: spontaneous breathing, nonlabored ventilation, respiratory function stable and patient connected to nasal cannula oxygen Cardiovascular status: blood pressure returned to baseline and stable Postop Assessment: no apparent nausea or vomiting Anesthetic complications: no   No notable events documented.   Last Vitals:  Vitals:   10/16/23 1730 10/16/23 1759  BP: 115/84 108/88  Pulse: (!) 104 100  Resp: 15 16  Temp: 37.3 C 37.2 C  SpO2: 100% 100%    Last Pain:  Vitals:   10/16/23 1958  TempSrc:   PainSc: 8                  Lattie Poli

## 2023-10-16 NOTE — Plan of Care (Signed)
  Problem: Coping: Goal: Ability to adjust to condition or change in health will improve Outcome: Progressing   Problem: Clinical Measurements: Goal: Diagnostic test results will improve Outcome: Progressing   Problem: Activity: Goal: Risk for activity intolerance will decrease Outcome: Progressing   Problem: Nutrition: Goal: Adequate nutrition will be maintained Outcome: Progressing   Problem: Coping: Goal: Level of anxiety will decrease Outcome: Progressing   Problem: Pain Managment: Goal: General experience of comfort will improve and/or be controlled Outcome: Progressing   Problem: Safety: Goal: Ability to remain free from injury will improve Outcome: Progressing

## 2023-10-16 NOTE — Anesthesia Preprocedure Evaluation (Signed)
 Anesthesia Evaluation  Patient identified by MRN, date of birth, ID band Patient awake    Reviewed: Allergy & Precautions, NPO status , Patient's Chart, lab work & pertinent test results  History of Anesthesia Complications Negative for: history of anesthetic complications  Airway Mallampati: II  TM Distance: >3 FB Neck ROM: Full    Dental no notable dental hx. (+) Teeth Intact   Pulmonary neg sleep apnea, neg COPD, Current Smoker and Patient abstained from smoking.   Pulmonary exam normal breath sounds clear to auscultation       Cardiovascular Exercise Tolerance: Good METShypertension, Pt. on medications (-) CAD and (-) Past MI (-) dysrhythmias  Rhythm:Regular Rate:Normal - Systolic murmurs    Neuro/Psych  PSYCHIATRIC DISORDERS  Depression    negative neurological ROS     GI/Hepatic ,neg GERD  ,,(+)     (-) substance abuse    Endo/Other  diabetes, Poorly Controlled, Insulin  Dependent  Hgb A1c today = 13.1. Last year around this time it was 11.8. Patient denies any GI symptoms today.  Renal/GU negative Renal ROS     Musculoskeletal   Abdominal   Peds  Hematology   Anesthesia Other Findings Past Medical History: No date: Diabetes mellitus without complication (HCC) No date: Hypertension  Reproductive/Obstetrics                             Anesthesia Physical Anesthesia Plan  ASA: 3  Anesthesia Plan: General   Post-op Pain Management: Ofirmev  IV (intra-op)*   Induction: Intravenous  PONV Risk Score and Plan: 2 and Propofol  infusion, TIVA, Ondansetron , Midazolam  and Dexamethasone  Airway Management Planned: Nasal Cannula and Natural Airway  Additional Equipment: None  Intra-op Plan:   Post-operative Plan:   Informed Consent: I have reviewed the patients History and Physical, chart, labs and discussed the procedure including the risks, benefits and alternatives for the  proposed anesthesia with the patient or authorized representative who has indicated his/her understanding and acceptance.     Dental advisory given  Plan Discussed with: CRNA and Surgeon  Anesthesia Plan Comments: (Last year patient had similar procedure on contralateral hand, which was performed using propofol  infusion and natural airway. No documented adverse events, and patient confirms he never had issues with anesthesia. His Hgb A1c today is 1.3 points higher than this time last year, but he denies any GI symptoms today, no n/v belching or bloating. He is NPO appropriate. I discussed r/b/a of regional anesthesia vs general anesthesia, including him benefitting from nerve block for pain control and ability to titrate medicines to still allow him to maintain his own airway (increased risk of aspiration given poorly controlled diabetes). Patient ultimately desires similar anesthetic to last time given his good tolerance of it, and understands his increased anesthetic risk because of poorly controlled diabetes and active smoking. Discussed risks of anesthesia with patient, including possibility of difficulty with spontaneous ventilation under anesthesia necessitating airway intervention, PONV, and rare risks such as cardiac or respiratory or neurological events, and allergic reactions. Discussed the role of CRNA in patient's perioperative care. Patient understands. Patient counseled on benefits of smoking cessation, and increased perioperative risks associated with continued smoking. )       Anesthesia Quick Evaluation

## 2023-10-16 NOTE — Progress Notes (Signed)
 PROGRESS NOTE  Gregory Gibson    DOB: 08-May-1976, 48 y.o.  WUJ:[id number]    Code Status: Full Code   DOA: 10/15/2023   LOS: 0   Brief hospital course  Marwan D Sanocki is a 48 y.o. male with a PMH significant for hypertension, hyperlipidemia, insulin -dependent diabetes mellitus, depression, who presents emergency department for chief concerns of left hand swelling. pt had similar occurrence on his right hand a year ago treated by Dr Alan All with I&D and Abx. presents this time with L hand swelling, pain- no injury. MRI findings showing pretty complex, widespread inflammation and abscess. previously on immune modulators by dermatology for severe eczema which he's no longer taking (states his last dose was around January) He is on topical steroids. Recently seen and treated for hyperglycemia. Was prescribed PO antibiotics for his hand but swelling has continued to worsen. Denies any injury to his hand.   ED course: temperature of 98.3, respiration rate 20, heart rate 108, blood pressure 138/72, SpO2 100% on room air. Serum sodium is 130, potassium 4.1, chloride 95, bicarb 24, BUN of 19, serum creatinine of 2.84, EGFR greater than 60, nonfasting blood glucose 378, WBC 10.3, hemoglobin 12.2, platelets of 341.AST, ALT, were within normal limits.  Lactic acid was elevated at 2.3.   ED treatment: Ceftriaxone  2 g IV one-time dose, vancomycin , LR 1 L bolus.  Assessment & Plan  Principal Problem:   Cellulitis of left hand Active Problems:   Essential hypertension   Hyperlipidemia   Eczema   Depression   Insulin  dependent type 2 diabetes mellitus (HCC)   Hyperosmolar hyponatremia  Cellulitis of left hand- abscess among other complex inflammation seen on MRI.  - continue antibiotics, IV - ortho consulted.  - NPO for possible procedure today. He had I&D last year on other hand with similar presentation.  - f/u lactic acid was elevated at 2.3 - Blood cultures x 2 are in process - analgesia PRN -  occupational therapy s/p procedure   Hyperosmolar hyponatremia Corrected serum sodium is 130   Insulin  dependent type 2 diabetes mellitus (HCC)- A1c 13.1 Home long-acting insulin  32 units daily- changed to morning administration for safety profile Insulin  SSI Goal inpatient glucose levels 140-180   Depression Home sertraline  50 mg daily resumed   Eczema Continue outpatient follow-up as appropriate   Hyperlipidemia Home lovastatin 10 mg nightly equivalent resumed on admission   Essential hypertension Home lisinopril  10 mg daily resumed  Body mass index is 21.4 kg/m.  VTE ppx: heparin injection 5,000 Units Start: 10/16/23 0600 Place TED hose Start: 10/15/23 1737  Diet:     Diet   Diet heart healthy/carb modified Room service appropriate? Yes; Fluid consistency: Thin   Consultants: Ortho surgery   Subjective 10/16/23    Pt reports significant pain in left hand. Denies any bites, injuries. Has same appearance of his R hand last year with same issue.    Objective   Vitals:   10/15/23 1907 10/15/23 1942 10/15/23 2121 10/16/23 0345  BP:  (!) 171/118 (!) 171/118 (!) 140/92  Pulse:  (!) 102  (!) 110  Resp:  16  14  Temp: 98.3 F (36.8 C) 97.9 F (36.6 C)  98.7 F (37.1 C)  TempSrc: Oral Oral  Oral  SpO2:  100%  99%  Weight:  69.6 kg    Height:  5\' 11"  (1.803 m)      Intake/Output Summary (Last 24 hours) at 10/16/2023 0739 Last data filed at 10/16/2023 0645 Gross per  24 hour  Intake 1167.76 ml  Output --  Net 1167.76 ml   Filed Weights   10/15/23 1247 10/15/23 1721 10/15/23 1942  Weight: 77.6 kg 77.6 kg 69.6 kg     Physical Exam:  General: awake, alert, NAD Respiratory: normal respiratory effort. Cardiovascular: quick capillary refill, normal S1/S2, RRR, no JVD, murmurs Nervous: A&O x3. no gross focal neurologic deficits, normal speech Extremities: joint deformity bilateral hands worse on left with significant edema of digits preventing flexion. Mild  erythema Skin: active eczema exacerbation diffusely  Psychiatry: normal mood, congruent affect  Labs   I have personally reviewed the following labs and imaging studies CBC    Component Value Date/Time   WBC 12.5 (H) 10/16/2023 0343   RBC 4.83 10/16/2023 0343   HGB 10.8 (L) 10/16/2023 0343   HGB 13.7 02/06/2014 1921   HCT 31.0 (L) 10/16/2023 0343   HCT 42.7 02/06/2014 1921   PLT 298 10/16/2023 0343   PLT 263 02/06/2014 1921   MCV 64.2 (L) 10/16/2023 0343   MCV 72 (L) 02/06/2014 1921   MCH 22.4 (L) 10/16/2023 0343   MCHC 34.8 10/16/2023 0343   RDW 13.9 10/16/2023 0343   RDW 14.2 02/06/2014 1921   LYMPHSABS 0.8 10/15/2023 1227   LYMPHSABS 2.8 10/30/2012 1128   MONOABS 0.6 10/15/2023 1227   MONOABS 0.6 10/30/2012 1128   EOSABS 0.1 10/15/2023 1227   EOSABS 0.2 10/30/2012 1128   BASOSABS 0.1 10/15/2023 1227   BASOSABS 0.1 10/30/2012 1128      Latest Ref Rng & Units 10/16/2023    3:43 AM 10/15/2023   12:27 PM 10/09/2023   11:22 AM  BMP  Glucose 70 - 99 mg/dL 147  829  562   BUN 6 - 20 mg/dL 11  19  15    Creatinine 0.61 - 1.24 mg/dL 1.30  8.65  7.84   Sodium 135 - 145 mmol/L 127  130  130   Potassium 3.5 - 5.1 mmol/L 3.7  4.1  4.0   Chloride 98 - 111 mmol/L 97  95  96   CO2 22 - 32 mmol/L 22  24  22    Calcium  8.9 - 10.3 mg/dL 8.8  9.6  9.2     No results found.  Disposition Plan & Communication  Patient status: Observation  Admitted From: Home Planned disposition location: Home Anticipated discharge date: 5/1 pending ortho workup   Family Communication: none at bedside    Author: Ree Candy, DO [facility] 10/16/2023, 7:39 AM   Available by Epic secure chat 7AM-7PM. If 7PM-7AM, please contact night-coverage.  [facility] contact information found on [email or web address].

## 2023-10-16 NOTE — Plan of Care (Signed)
   Problem: Coping: Goal: Ability to adjust to condition or change in health will improve Outcome: Progressing   Problem: Fluid Volume: Goal: Ability to maintain a balanced intake and output will improve Outcome: Progressing   Problem: Nutritional: Goal: Maintenance of adequate nutrition will improve Outcome: Progressing   Problem: Skin Integrity: Goal: Risk for impaired skin integrity will decrease Outcome: Progressing

## 2023-10-16 NOTE — Anesthesia Procedure Notes (Signed)
 Procedure Name: Intubation Date/Time: 10/16/2023 4:20 PM  Performed by: Curvin Downing, CRNAPre-anesthesia Checklist: Patient identified, Emergency Drugs available, Suction available and Patient being monitored Patient Re-evaluated:Patient Re-evaluated prior to induction Oxygen Delivery Method: Circle system utilized Preoxygenation: Pre-oxygenation with 100% oxygen Induction Type: IV induction Ventilation: Mask ventilation without difficulty LMA: LMA inserted LMA Size: 5.0 Number of attempts: 1 Airway Equipment and Method: Oral airway Placement Confirmation: positive ETCO2 and breath sounds checked- equal and bilateral Tube secured with: Tape Dental Injury: Teeth and Oropharynx as per pre-operative assessment

## 2023-10-16 NOTE — Consult Note (Signed)
 ORTHOPAEDIC CONSULTATION  REQUESTING PHYSICIAN: Ree Candy, MD  Chief Complaint:   Left hand abscess  History of Present Illness: Gregory Gibson is a 48 y.o. male  with a PMH significant for hypertension, hyperlipidemia, insulin -dependent diabetes mellitus, depression, presented to the emergency room yesterday with hand swelling over the ulnar aspect of his hand.  Patient reports he had a similar hand swelling in his contralateral hand a year ago and was found to have significant abscesses at that time requiring irrigation and debridement.  The patient was previously on immune modulators for severe eczema and is no longer on it and has been using high-dose topical steroids on his hands.  Reports he has been scratching his left hand for several days and developed the swelling and pain and came in for evaluation due to that he has history he was seen outpatient and prescribed p.o. antibiotics but did not improve.  MRI was performed which showed a fluid collection orthopedics was consulted.  Patient denies any fevers at this time works pain and the ulnar aspect of the palm of his left hand.  Does report that he is right-hand dominant.  Denies any specific injury scratches or cuts to the hand itself.  Past Medical History:  Diagnosis Date   Diabetes mellitus without complication (HCC)    Hypertension    Past Surgical History:  Procedure Laterality Date   APPENDECTOMY     I & D EXTREMITY Right 10/28/2022   Procedure: IRRIGATION AND DEBRIDEMENT RIGHT HAND;  Surgeon: Rande Bushy, MD;  Location: [facility] ORS;  Service: Orthopedics;  Laterality: Right;   Social History   Socioeconomic History   Marital status: Single    Spouse name: Not on file   Number of children: Not on file   Years of education: Not on file   Highest education level: Not on file  Occupational History   Not on file  Tobacco Use   Smoking status: Every  Day    Types: Cigarettes   Smokeless tobacco: Never  Vaping Use   Vaping status: Some Days  Substance and Sexual Activity   Alcohol use: Yes    Alcohol/week: 14.0 standard drinks of alcohol    Types: 7 Cans of beer, 7 Shots of liquor per week   Drug use: Never   Sexual activity: Not on file  Other Topics Concern   Not on file  Social History Narrative   Not on file   Social Drivers of Health   Financial Resource Strain: Not on file  Food Insecurity: No Food Insecurity (10/15/2023)   Hunger Vital Sign    Worried About Running Out of Food in the Last Year: Never true    Ran Out of Food in the Last Year: Never true  Transportation Needs: No Transportation Needs (10/15/2023)   PRAPARE - Administrator, Civil Service (Medical): No    Lack of Transportation (Non-Medical): No  Physical Activity: Not on file  Stress: Not on file  Social Connections: Unknown (10/15/2023)   Social Connection and Isolation Panel [NHANES]    Frequency of Communication with Friends and Family: Patient declined    Frequency of Social Gatherings with Friends and Family: Patient declined    Attends Religious Services: Patient declined    Database administrator or Organizations: Patient declined    Attends Banker Meetings: Patient declined    Marital Status: Married   History reviewed. No pertinent family history. Allergies  Allergen Reactions   Gadavist  [Gadobutrol ]  Nausea And Vomiting    Patient vomits immediately  after injection   Prior to Admission medications   Medication Sig Start Date End Date Taking? Authorizing Provider  hydrOXYzine (ATARAX) 25 MG tablet Take 25 mg by mouth at bedtime. 08/05/23  Yes [provider]  ibuprofen  (ADVIL ) 800 MG tablet Take 1 tablet (800 mg total) by mouth every 8 (eight) hours as needed. 06/22/23  Yes Ward, Clover Dao, DO  insulin  glargine-yfgn (SEMGLEE ) 100 UNIT/ML Pen Inject 32 Units into the skin at bedtime. May substitute as needed  per insurance. 10/30/22  Yes Wieting, Richard, MD  insulin  lispro (HUMALOG ) 100 UNIT/ML KwikPen Inject 0-21 Units into the skin 3 (three) times daily with meals. If eating and Blood Glucose (BG) 80 or higher inject 10 units for meal coverage and add correction dose per scale. If not eating, correction dose only. BG <150= 0 unit; BG 150-200= 1 unit; BG 201-250= 3 unit; BG 251-300= 5 unit; BG 301-350= 7 unit; BG 351-400= 9 unit; BG >400= 11 unit and Call Primary Care. 10/30/22  Yes Wieting, Richard, MD  lisinopril  (ZESTRIL ) 10 MG tablet Take 1 tablet (10 mg total) by mouth daily. 10/09/23 01/07/24 Yes Kandee Orion, MD  lovastatin (MEVACOR) 10 MG tablet Take 10 mg by mouth at bedtime.   Yes [provider]  metFORMIN  (GLUCOPHAGE ) 1000 MG tablet Take 1,000 mg by mouth 2 (two) times daily with a meal. 09/26/15  Yes [provider]  ondansetron  (ZOFRAN -ODT) 4 MG disintegrating tablet Take 1 tablet (4 mg total) by mouth every 6 (six) hours as needed for nausea or vomiting. 06/22/23  Yes Ward, Clover Dao, DO  oxyCODONE -acetaminophen  (PERCOCET) 5-325 MG tablet Take 2 tablets by mouth every 8 (eight) hours as needed. 06/22/23 06/21/24 Yes Ward, Clover Dao, DO  sulfamethoxazole -trimethoprim  (BACTRIM  DS) 800-160 MG tablet Take 1 tablet by mouth 2 (two) times daily for 7 days. 10/09/23 10/16/23 Yes Kandee Orion, MD  amoxicillin -clavulanate (AUGMENTIN ) 875-125 MG tablet Take 1 tablet by mouth 2 (two) times daily. Patient not taking: Reported on 10/15/2023 06/22/23   Ward, Clover Dao, DO  atorvastatin  (LIPITOR) 20 MG tablet Take 20 mg by mouth daily. Patient not taking: Reported on 10/15/2023 06/06/22   [provider]  Blood Glucose Monitoring Suppl DEVI 1 each by Does not apply route 3 (three) times daily. May dispense any manufacturer covered by patient's insurance. 10/30/22   Verla Glaze, MD  chlorhexidine  (PERIDEX ) 0.12 % solution Use as directed 15 mLs in the mouth or throat 2 (two) times  daily. Patient not taking: Reported on 10/15/2023 06/22/23   Ward, Clover Dao, DO  clobetasol cream (TEMOVATE) 0.05 % Apply 1 Application topically 2 (two) times a week. 10/10/22   [provider]  Continuous Glucose Sensor (DEXCOM G7 SENSOR) MISC 1 Units by Does not apply route with breakfast, with lunch, and with evening meal. 10/09/23   Kandee Orion, MD  fenofibrate  (TRICOR ) 145 MG tablet Take 145 mg by mouth daily. 06/06/22   [provider]  Glucose Blood (BLOOD GLUCOSE TEST STRIPS) STRP 1 each by Does not apply route 3 (three) times daily. Use as directed to check blood sugar. May dispense any manufacturer covered by patient's insurance and fits patient's device. 10/30/22   Verla Glaze, MD  Insulin  Pen Needle (BD PEN NEEDLE NANO U/F) 32G X 4 MM MISC 1 Dose by Does not apply route 3 (three) times daily. May dispense any manufacturer covered by patient's insurance. 10/09/23  Kandee Orion, MD  Lancet Device MISC 1 each by Does not apply route 3 (three) times daily. May dispense any manufacturer covered by patient's insurance. 10/30/22   Verla Glaze, MD  Lancets MISC 1 each by Does not apply route 3 (three) times daily. Use as directed to check blood sugar. May dispense any manufacturer covered by patient's insurance and fits patient's device. 10/30/22   Verla Glaze, MD  sertraline  (ZOLOFT ) 50 MG tablet Take 50 mg by mouth daily. Patient not taking: Reported on 10/15/2023 06/06/22   [provider]  triamcinolone cream (KENALOG) 0.1 % PLEASE SEE ATTACHED FOR DETAILED DIRECTIONS Patient not taking: Reported on 10/15/2023 10/08/22   [provider]   MR HAND LEFT W WO CONTRAST Result Date: 10/16/2023 CLINICAL DATA:  Left hand swelling and pain underneath the fifth digit. EXAM: MRI OF THE LEFT HAND WITHOUT AND WITH CONTRAST TECHNIQUE: Multiplanar, multisequence MR imaging of the left hand was performed before and after the administration of intravenous  contrast. CONTRAST:  6mL GADAVIST  GADOBUTROL  1 MMOL/ML IV SOLN COMPARISON:  None Available. FINDINGS: Bones/Joint/Cartilage No marrow signal abnormality. No fracture or dislocation. Normal alignment. No joint effusion. No erosive changes. Patchy subchondral cystic change at the lunotriquetral articulation. Subcortical cystic change in the second and third metacarpal heads. Mild degenerative changes of the first MCP joint. Ligaments Collateral ligaments are intact. Extrinsic ligaments are intact. Scapholunate and lunotriquetral ligaments are intact. Soft tissue and Muscles Irregular lobulated heterogenous T2 hyperintense/T1 Iso to hypointense peripherally enhancing fluid signal within the volar soft tissues of the ulnar hand, extending along the inferior margin of the flexor digiti minimi brevis muscle proximally, from its origin near the hook of the hamate, with intramuscular extension into the flexor digiti minimus brevis muscle distally and possible extension into the abductor digiti minimi muscle (series 5, image 21 and 22). This irregular region of inflammatory change, most concerning for soft tissue/intramuscular abscess, continues distally extending along the volar subcutaneous soft tissues of the proximal fifth digit to the level of the mid fifth proximal phalanx, near the insertion of the flexor digiti minimi brevis muscle. This collection measures up to 5.3 x 3.1 x 1.3 cm (series 5, images 18-29; and series 8, images 5-10) with surrounding soft tissue edema and enhancement. Soft tissue edema and enhancement extends to the level of Guyon's canal proximally with the deep margin of the irregular collection described above abutting the traversing ulnar artery and nerve (series 10, images 15-17). Thickening and intermediate signal of the fifth digit flexor tendon as it traverses subjacent to the overlying region of inflammatory change described below. Trace mild tenosynovitis of the second through fifth digit  flexor tendons. Flexor and extensor compartment tendons are otherwise intact. IMPRESSION: 1. Findings concerning for 5.3 x 3.1 x 1.3 cm irregular peripherally enhancing soft tissue/intramuscular abscess within the volar soft tissues of the left ulnar hand, extending along the inferior margin of the flexor digiti minimi brevis muscle proximally, from near its origin at the hook of the hamate, with intramuscular extension into the flexor digiti minimus brevis muscle distally and possible extension into the abductor digiti minimi muscle. This complex collection extends along the volar soft tissues of the hand to the level of the fifth proximal phalanx, near the insertion of the flexor digiti minimi brevis muscle. 2. Associated soft tissue/intramuscular edema and enhancement involving the surrounding flexor digiti minimi brevis, abductor digiti minimi, and opponens digiti minimi muscles as well as the adjacent dorsal and volar interosseous muscles, concerning  for myositis/fasciitis with surrounding cellulitis. Soft tissue inflammatory changes extend to the level of Guyon's canal proximally with the deep margin of the irregular collection described above abutting the traversing ulnar artery and nerve. 3. Mild tenosynovitis of second through fifth digit flexor tendons. 4. No acute osseous abnormality. Electronically Signed   By: Mannie Seek M.D.   On: 10/16/2023 09:27    Positive ROS: All other systems have been reviewed and were otherwise negative with the exception of those mentioned in the HPI and as above.  Physical Exam: General:  Alert, no acute distress Psychiatric:  Patient is competent for consent with normal mood and affect   Cardiovascular:  No pedal edema Respiratory:  No wheezing, non-labored breathing GI:  Abdomen is soft and non-tender Skin:  No lesions in the area of chief complaint Neurologic:  Sensation intact distally Lymphatic:  No axillary or cervical lymphadenopathy  Orthopedic  Exam:  Left upper extremity Swelling and erythema noted to the ulnar aspect of the palm with tenderness to palpation Chronic changes of all the skin on both forearms and hands with an eczematous rash no obvious breaks in the skin No tenderness palpation or any fluctuance over the radial aspect of the palm or dorsal hand.  No pain with passive motion of any of the fingers. Compartments all soft Neurovascular intact able to move all fingers intact radial pulse  X-rays:  Imaging reviewed MRI of the left hand full report above.  There is a complex collection consistent with an abscess in the aspect of the palm just deep to the skin and fascia.  Minimal tendon involvement no significant tenosynovitis noted.  No joint involvement noted or any effusions in any of the metacarpal phalangeal joints.  Myositis and cellulitis noted.  Agree with radiology interpretation.  Assessment: Left hand ulnar palm abscess  Plan: I reviewed the clinical findings with the patient.  He has a complex abscess in the ulnar aspect of his left palm.  Does not appear to involve the tendons both clinically and on the MRI.  He does not appear to have any joint involvement either.  I do not concern for flexor tenosynovitis at this time however he does have a fairly significant abscess.  I reviewed treatment option with patient including continuing with antibiotics versus incision and drainage.  Given his failure to improve with antibiotics I think the patient would benefit from a local incision and drainage.  We discussed the risk benefits procedure including risk of persistent infection, deeper infection, damage to nerves, vessels, ligaments, tendons, osteomyelitis, possibility for additional surgery.  We did discuss the use of a drain and I will likely place a drain at the end of the procedure.  All questions answered patient agrees above plan to proceed to the operating room today for irrigation and debridement incision and drainage  of his left hand.  N.p.o. for the OR patient reports last meal at 8 AM.  Will fill out consent the preop holding room.    Venus Ginsberg MD  Beeper #:  [phone]  10/16/2023 1:32 PM

## 2023-10-16 NOTE — Transfer of Care (Signed)
 Immediate Anesthesia Transfer of Care Note  Patient: Gregory Gibson  Procedure(s) Performed: INCISION AND DRAINAGE OF DEEP ABSCESS, HAND (Left)  Patient Location: PACU  Anesthesia Type:General  Level of Consciousness: drowsy  Airway & Oxygen Therapy: Patient Spontanous Breathing and Patient connected to face mask oxygen  Post-op Assessment: Report given to RN  Post vital signs: stable  Last Vitals:  Vitals Value Taken Time  BP 113/81 10/16/23 1652  Temp    Pulse 94 10/16/23 1654  Resp    SpO2 100 % 10/16/23 1654  Vitals shown include unfiled device data.  Last Pain:  Vitals:   10/16/23 1538  TempSrc: Temporal  PainSc:          Complications: No notable events documented.

## 2023-10-17 ENCOUNTER — Encounter: Payer: Self-pay | Admitting: Orthopedic Surgery

## 2023-10-17 DIAGNOSIS — L03114 Cellulitis of left upper limb: Secondary | ICD-10-CM | POA: Diagnosis not present

## 2023-10-17 DIAGNOSIS — E870 Hyperosmolality and hypernatremia: Secondary | ICD-10-CM

## 2023-10-17 DIAGNOSIS — E119 Type 2 diabetes mellitus without complications: Secondary | ICD-10-CM

## 2023-10-17 DIAGNOSIS — E785 Hyperlipidemia, unspecified: Secondary | ICD-10-CM

## 2023-10-17 DIAGNOSIS — L02512 Cutaneous abscess of left hand: Secondary | ICD-10-CM

## 2023-10-17 DIAGNOSIS — F32A Depression, unspecified: Secondary | ICD-10-CM | POA: Diagnosis not present

## 2023-10-17 DIAGNOSIS — I10 Essential (primary) hypertension: Secondary | ICD-10-CM | POA: Diagnosis not present

## 2023-10-17 DIAGNOSIS — E871 Hypo-osmolality and hyponatremia: Secondary | ICD-10-CM

## 2023-10-17 LAB — COMPREHENSIVE METABOLIC PANEL WITH GFR
ALT: 16 U/L (ref 0–44)
AST: 21 U/L (ref 15–41)
Albumin: 2.8 g/dL — ABNORMAL LOW (ref 3.5–5.0)
Alkaline Phosphatase: 86 U/L (ref 38–126)
Anion gap: 8 (ref 5–15)
BUN: 10 mg/dL (ref 6–20)
CO2: 23 mmol/L (ref 22–32)
Calcium: 8.9 mg/dL (ref 8.9–10.3)
Chloride: 103 mmol/L (ref 98–111)
Creatinine, Ser: 0.61 mg/dL (ref 0.61–1.24)
GFR, Estimated: >60 mL/min (ref >60)
Glucose, Bld: 289 mg/dL — ABNORMAL HIGH (ref 70–99)
Potassium: 4.1 mmol/L (ref 3.5–5.1)
Sodium: 129 mmol/L — ABNORMAL LOW (ref 135–145)
Total Bilirubin: 0.3 mg/dL (ref 0.0–1.2)
Total Protein: 6 g/dL — ABNORMAL LOW (ref 6.5–8.1)

## 2023-10-17 LAB — GLUCOSE, CAPILLARY
Glucose-Capillary: 213 mg/dL — ABNORMAL HIGH (ref 70–99)
Glucose-Capillary: 222 mg/dL — ABNORMAL HIGH (ref 70–99)
Glucose-Capillary: 266 mg/dL — ABNORMAL HIGH (ref 70–99)
Glucose-Capillary: 290 mg/dL — ABNORMAL HIGH (ref 70–99)
Glucose-Capillary: 354 mg/dL — ABNORMAL HIGH (ref 70–99)
Glucose-Capillary: 376 mg/dL — ABNORMAL HIGH (ref 70–99)

## 2023-10-17 LAB — CBC
HCT: 29 % — ABNORMAL LOW (ref 39.0–52.0)
Hemoglobin: 9.7 g/dL — ABNORMAL LOW (ref 13.0–17.0)
MCH: 22.1 pg — ABNORMAL LOW (ref 26.0–34.0)
MCHC: 33.4 g/dL (ref 30.0–36.0)
MCV: 66.2 fL — ABNORMAL LOW (ref 80.0–100.0)
Platelets: 302 10*3/uL (ref 150–400)
RBC: 4.38 MIL/uL (ref 4.22–5.81)
RDW: 14.2 % (ref 11.5–15.5)
WBC: 12.3 10*3/uL — ABNORMAL HIGH (ref 4.0–10.5)
nRBC: 0 % (ref 0.0–0.2)

## 2023-10-17 LAB — LACTIC ACID, PLASMA: Lactic Acid, Venous: 1.2 mmol/L (ref 0.5–1.9)

## 2023-10-17 MED ORDER — INSULIN ASPART 100 UNIT/ML IJ SOLN
5.0000 [IU] | Freq: Once | INTRAMUSCULAR | Status: AC
  Administered 2023-10-17: 5 [IU] via SUBCUTANEOUS
  Filled 2023-10-17: qty 1

## 2023-10-17 MED ORDER — INSULIN GLARGINE-YFGN 100 UNIT/ML ~~LOC~~ SOLN
20.0000 [IU] | Freq: Two times a day (BID) | SUBCUTANEOUS | Status: DC
  Administered 2023-10-17 – 2023-10-18 (×2): 20 [IU] via SUBCUTANEOUS
  Filled 2023-10-17 (×3): qty 0.2

## 2023-10-17 MED ORDER — SODIUM CHLORIDE 0.9 % IV SOLN
2.0000 g | INTRAVENOUS | Status: DC
  Administered 2023-10-17: 2 g via INTRAVENOUS
  Filled 2023-10-17: qty 20

## 2023-10-17 MED ORDER — INSULIN ASPART 100 UNIT/ML IJ SOLN
0.0000 [IU] | Freq: Three times a day (TID) | INTRAMUSCULAR | Status: DC
  Administered 2023-10-17: 5 [IU] via SUBCUTANEOUS
  Administered 2023-10-17: 8 [IU] via SUBCUTANEOUS
  Administered 2023-10-17 (×2): 5 [IU] via SUBCUTANEOUS
  Filled 2023-10-17 (×5): qty 1

## 2023-10-17 NOTE — Evaluation (Signed)
 Occupational Therapy Evaluation Patient Details Name: Gregory Gibson MRN: [id number] DOB: 06/15/1976 Today's Date: 10/17/2023   History of Present Illness   [name] is a 48 year old male with history of hypertension, hyperlipidemia, insulin -dependent diabetes mellitus, depression, who presents emergency department for chief concerns of left hand swelling. Pt is now s/p I&D of L ulnar palm abscess on 4/29.     Clinical Impressions Prior to hospital admission, pt was independent. Pt reports pain is much improved in his LUE, and he has been performing ADLs/mobility independently in room. Educated on LUE NWB status, elevating UE to prevent edema, and AROM of digits to protect joint integrity & decrease swelling. 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 Further questions regarding timeline for return to work deferred to ortho/attending MD given the physical nature of pt's job. Anticipate the need for follow up outpatient OT services upon acute hospital DC. No further acute OT needs identified, OT will sign off. If new needs arise, please re-consult.      If plan is discharge home, recommend the following:   Assist for transportation     Functional Status Assessment   Patient has had a recent decline in their functional status and demonstrates the ability to make significant improvements in function in a reasonable and predictable amount of time.     Equipment Recommendations   None recommended by OT      Precautions/Restrictions   Restrictions Weight Bearing Restrictions Per Provider Order: Yes LUE Weight Bearing Per Provider Order: Non weight bearing     Mobility Bed Mobility Overal bed mobility: Independent                  Transfers Overall transfer level: Independent                        Balance Overall balance assessment:  Independent                                         ADL either performed or assessed with clinical judgement   ADL Overall ADL's : Independent;At baseline                                              Pertinent Vitals/Pain Pain Assessment Pain Assessment: No/denies pain     Extremity/Trunk Assessment Upper Extremity Assessment Upper Extremity Assessment: Right hand dominant;LUE deficits/detail;Overall WFL for tasks assessed LUE Deficits / Details: LUE in ace bandage   Lower Extremity Assessment Lower Extremity Assessment: Overall WFL for tasks assessed   Cervical / Trunk Assessment Cervical / Trunk Assessment: Normal   Communication Communication Communication: No apparent difficulties   Cognition Arousal: Alert Behavior During Therapy: WFL for tasks assessed/performed Cognition: No apparent impairments                               Following commands: Intact       Cueing  General Comments   Cueing Techniques: Verbal cues  LUE with dressing intact           Home Living Family/patient expects to be  discharged to:: Private residence Living Arrangements: Spouse/significant other Available Help at Discharge: Available PRN/intermittently Type of Home: House Home Access: Stairs to enter [employer] of Steps: 5   Home Layout: One level     Bathroom Shower/Tub: Tub/shower unit         Home Equipment: None          Prior Functioning/Environment Prior Level of Function : Independent/Modified Independent;Driving;Working/employed             Mobility Comments: 1 fall while redoing bathroom in Jan (tripped over his dog). Independent. ADLs Comments: Independent, works full-time and drives.            OT Goals(Current goals can be found in the care plan section)   Acute Rehab OT Goals OT Goal Formulation: All assessment and education complete, DC therapy   AM-PAC OT "6 Clicks" Daily  Activity     Outcome Measure Help from another person eating meals?: None Help from another person taking care of personal grooming?: None Help from another person toileting, which includes using toliet, bedpan, or urinal?: None Help from another person bathing (including washing, rinsing, drying)?: None Help from another person to put on and taking off regular upper body clothing?: None Help from another person to put on and taking off regular lower body clothing?: None 6 Click Score: 24   End of Session Nurse Communication: Mobility status  Activity Tolerance: Patient tolerated treatment well Patient left: in bed;with call bell/phone within reach  OT Visit Diagnosis: Other abnormalities of gait and mobility (R26.89)                Time: 1610-9604 OT Time Calculation (min): 15 min Charges:  OT General Charges $OT Visit: 1 Visit OT Evaluation $OT Eval Low Complexity: 1 Low Gregory Gibson L. Orby Tangen, OTR/L  10/17/23, 12:42 PM

## 2023-10-17 NOTE — TOC Progression Note (Signed)
 Transition of Care [facility]) - Progression Note    Patient Details  Name: ORREN TIMBLIN MRN: [id number] Date of Birth: 1975/09/02  Transition of Care [facility]) CM/SW Contact  Alexandra Ice, RN Phone Number: 10/17/2023, 4:32 PM  Clinical Narrative:    Met with patient discussed discharge plan, OT recommends outpatient OT therapy. He is agreeable, would like to go to [facility] outpatient [facility].         Expected Discharge Plan and Services                                               Social Determinants of Health (SDOH) Interventions SDOH Screenings   Food Insecurity: No Food Insecurity (10/15/2023)  Housing: Low Risk  (10/15/2023)  Transportation Needs: No Transportation Needs (10/15/2023)  Utilities: Not At Risk (10/15/2023)  Social Connections: Unknown (10/15/2023)  Tobacco Use: High Risk (10/15/2023)    Readmission Risk Interventions     No data to display

## 2023-10-17 NOTE — Progress Notes (Signed)
   Subjective: 1 Day Post-Op Procedure(s) (LRB): INCISION AND DRAINAGE OF DEEP ABSCESS, HAND (Left) Patient reports pain as mild.  Pain much improved Patient is well, and has had no acute complaints or problems Denies any CP, SOB, ABD pain. We will continue therapy today.  Plan is to go Home after hospital stay.  Objective: Vital signs in last 24 hours: Temp:  [98.2 F (36.8 C)-99.2 F (37.3 C)] 98.2 F (36.8 C) (04/30 0806) Pulse Rate:  [82-104] 82 (04/30 0806) Resp:  [13-20] 17 (04/30 0806) BP: (87-132)/(57-95) 118/89 (04/30 0806) SpO2:  [98 %-100 %] 100 % (04/30 0806)  Intake/Output from previous day: 04/29 0701 - 04/30 0700 In: 950 [I.V.:400; IV Piggyback:550] Out: 1360 [Urine:1350; Blood:10] Intake/Output this shift: Total I/O In: 240 [P.O.:240] Out: -   Recent Labs    10/15/23 1227 10/16/23 0343 10/17/23 0424  HGB 12.2* 10.8* 9.7*   Recent Labs    10/16/23 0343 10/17/23 0424  WBC 12.5* 12.3*  RBC 4.83 4.38  HCT 31.0* 29.0*  PLT 298 302   Recent Labs    10/16/23 0343 10/17/23 0424  NA 127* 129*  K 3.7 4.1  CL 97* 103  CO2 22 23  BUN 11 10  CREATININE 0.64 0.61  GLUCOSE 245* 289*  CALCIUM  8.8* 8.9   No results for input(s): "LABPT", "INR" in the last 72 hours.  EXAM General - Patient is Alert, Appropriate, and Oriented Extremity - Neurovascular intact Sensation intact distally Intact pulses distally Close to full composite fist. Dressing - dressing C/D/I and no drainage   Past Medical History:  Diagnosis Date   Diabetes mellitus without complication (HCC)    Hypertension     Assessment/Plan:   1 Day Post-Op Procedure(s) (LRB): INCISION AND DRAINAGE OF DEEP ABSCESS, HAND (Left) Principal Problem:   Cellulitis of left hand Active Problems:   Essential hypertension   Hyperlipidemia   Eczema   Depression   Insulin  dependent type 2 diabetes mellitus (HCC)   Hyperosmolar hyponatremia   Cellulitis   Abscess of left  hand  Estimated body mass index is 21.4 kg/m as calculated from the following:   Height as of this encounter: 5\' 11"  (1.803 m).   Weight as of this encounter: 69.6 kg. Pain and swelling much improved after I&D. Continue with antibiotics.  Will remove dressing and Penrose drain tomorrow morning  T. Thomos Flies, PA-C [facility] 10/17/2023, 12:17 PM

## 2023-10-17 NOTE — Plan of Care (Addendum)
 Blood glucose level elevated, night covering provider notified, 5 units of fast acting insulin  given per order.    Pt also declined midnight as his HR was no longer elevated and BP was WNL. This morning IV metop not given due BP being soft. Recheck of BP was WNL.   The IV metoprolol started yesterday and is now q6h scheduled.   Problem: Fluid Volume: Goal: Ability to maintain a balanced intake and output will improve Outcome: Progressing   Problem: Coping: Goal: Ability to adjust to condition or change in health will improve Outcome: Progressing   Problem: Metabolic: Goal: Ability to maintain appropriate glucose levels will improve Outcome: Progressing   Problem: Skin Integrity: Goal: Risk for impaired skin integrity will decrease Outcome: Progressing   Problem: Tissue Perfusion: Goal: Adequacy of tissue perfusion will improve Outcome: Progressing   Problem: Education: Goal: Knowledge of General Education information will improve Description: Including pain rating scale, medication(s)/side effects and non-pharmacologic comfort measures Outcome: Progressing

## 2023-10-17 NOTE — Plan of Care (Signed)
  Problem: Nutritional: Goal: Progress toward achieving an optimal weight will improve Outcome: Progressing   Problem: Skin Integrity: Goal: Risk for impaired skin integrity will decrease Outcome: Progressing   Problem: Clinical Measurements: Goal: Will remain free from infection Outcome: Progressing Goal: Diagnostic test results will improve Outcome: Progressing

## 2023-10-17 NOTE — Progress Notes (Signed)
 PROGRESS NOTE  Gregory Gibson    DOB: Oct 18, 1975, 48 y.o.  ZOX:[id number]    Code Status: Full Code   DOA: 10/15/2023   LOS: 1   Brief hospital course  Gregory Gibson is a 48 y.o. male with a PMH significant for hypertension, hyperlipidemia, insulin -dependent diabetes mellitus, depression, who presents emergency department for chief concerns of left hand swelling. pt had similar occurrence on his right hand a year ago treated by Dr Alan All with I&D and Abx. presents this time with L hand swelling, pain- no injury. MRI findings showing pretty complex, widespread inflammation and abscess. previously on immune modulators by dermatology for severe eczema which he's no longer taking (states his last dose was around January) He is on topical steroids. Recently seen and treated for hyperglycemia. Was prescribed PO antibiotics for his hand but swelling has continued to worsen. Denies any injury to his hand.   ED course: temperature of 98.3, respiration rate 20, heart rate 108, blood pressure 138/72, SpO2 100% on room air. Serum sodium is 130, potassium 4.1, chloride 95, bicarb 24, BUN of 19, serum creatinine of 2.84, EGFR greater than 60, nonfasting blood glucose 378, WBC 10.3, hemoglobin 12.2, platelets of 341.AST, ALT, were within normal limits.  Lactic acid was elevated at 2.3.   ED treatment: Ceftriaxone  2 g IV one-time dose, vancomycin , LR 1 L bolus.  4/30: S/p I&D by orthopedic surgery, cultures growing gram-positive cocci in pairs-pending final results.  Assessment & Plan  Principal Problem:   Cellulitis of left hand Active Problems:   Essential hypertension   Hyperlipidemia   Eczema   Depression   Insulin  dependent type 2 diabetes mellitus (HCC)   Hyperosmolar hyponatremia   Cellulitis   Abscess of left hand  Cellulitis of left hand- abscess among other complex inflammation seen on MRI.  - continue antibiotics, IV - ortho consulted.  -S/p I&D by orthopedic surgery - Blood cultures  remain negative, wound cultures growing gram-positive cocci in pairs-pending final results - analgesia PRN - occupational therapy s/p procedure   Hyperosmolar hyponatremia Corrected serum sodium is 132   Insulin  dependent type 2 diabetes mellitus (HCC)- A1c 13.1 Goal inpatient glucose levels 140-180, CBG remained elevated -Increase Semglee  to 20 units twice daily -Continue with SSI   Depression Home sertraline  50 mg daily resumed   Eczema Continue outpatient follow-up as appropriate   Hyperlipidemia Home lovastatin 10 mg nightly equivalent resumed on admission   Essential hypertension Home lisinopril  10 mg daily resumed  Body mass index is 21.4 kg/m.  VTE ppx: SCDs Start: 10/16/23 1922 heparin injection 5,000 Units Start: 10/16/23 0600 Place TED hose Start: 10/15/23 1737  Diet:     Diet   Diet Carb Modified Fluid consistency: Thin; Room service appropriate? Yes   Consultants: Ortho surgery   Subjective 10/17/23    Patient with much improved pain and swelling today.  Moving all the fingers.   Objective   Vitals:   10/17/23 0543 10/17/23 0806 10/17/23 1415 10/17/23 1554  BP: 115/80 118/89 103/69 119/85  Pulse: 85 82 88 97  Resp:  17  17  Temp:  98.2 F (36.8 C)  98.2 F (36.8 C)  TempSrc:  Oral    SpO2:  100%  99%  Weight:      Height:        Intake/Output Summary (Last 24 hours) at 10/17/2023 1605 Last data filed at 10/17/2023 1500 Gross per 24 hour  Intake 1540 ml  Output 1360 ml  Net 180  ml   Filed Weights   10/15/23 1247 10/15/23 1721 10/15/23 1942  Weight: 77.6 kg 77.6 kg 69.6 kg     Physical Exam:  General.  Well-developed gentleman, in no acute distress. Pulmonary.  Lungs clear bilaterally, normal respiratory effort. CV.  Regular rate and rhythm, no JVD, rub or murmur. Abdomen.  Soft, nontender, nondistended, BS positive. CNS.  Alert and oriented .  No focal neurologic deficit. Extremities.  No edema, no cyanosis, pulses intact and  symmetrical.  Left hand in Ace wrap Psychiatry.  Judgment and insight appears normal.   Labs   I have personally reviewed the following labs and imaging studies CBC    Component Value Date/Time   WBC 12.3 (H) 10/17/2023 0424   RBC 4.38 10/17/2023 0424   HGB 9.7 (L) 10/17/2023 0424   HGB 13.7 02/06/2014 1921   HCT 29.0 (L) 10/17/2023 0424   HCT 42.7 02/06/2014 1921   PLT 302 10/17/2023 0424   PLT 263 02/06/2014 1921   MCV 66.2 (L) 10/17/2023 0424   MCV 72 (L) 02/06/2014 1921   MCH 22.1 (L) 10/17/2023 0424   MCHC 33.4 10/17/2023 0424   RDW 14.2 10/17/2023 0424   RDW 14.2 02/06/2014 1921   LYMPHSABS 0.8 10/15/2023 1227   LYMPHSABS 2.8 10/30/2012 1128   MONOABS 0.6 10/15/2023 1227   MONOABS 0.6 10/30/2012 1128   EOSABS 0.1 10/15/2023 1227   EOSABS 0.2 10/30/2012 1128   BASOSABS 0.1 10/15/2023 1227   BASOSABS 0.1 10/30/2012 1128      Latest Ref Rng & Units 10/17/2023    4:24 AM 10/16/2023    3:43 AM 10/15/2023   12:27 PM  BMP  Glucose 70 - 99 mg/dL 295  621  308   BUN 6 - 20 mg/dL 10  11  19    Creatinine 0.61 - 1.24 mg/dL 6.57  8.46  9.62   Sodium 135 - 145 mmol/L 129  127  130   Potassium 3.5 - 5.1 mmol/L 4.1  3.7  4.1   Chloride 98 - 111 mmol/L 103  97  95   CO2 22 - 32 mmol/L 23  22  24    Calcium  8.9 - 10.3 mg/dL 8.9  8.8  9.6     MR HAND LEFT W WO CONTRAST Result Date: 10/16/2023 CLINICAL DATA:  Left hand swelling and pain underneath the fifth digit. EXAM: MRI OF THE LEFT HAND WITHOUT AND WITH CONTRAST TECHNIQUE: Multiplanar, multisequence MR imaging of the left hand was performed before and after the administration of intravenous contrast. CONTRAST:  6mL GADAVIST  GADOBUTROL  1 MMOL/ML IV SOLN COMPARISON:  None Available. FINDINGS: Bones/Joint/Cartilage No marrow signal abnormality. No fracture or dislocation. Normal alignment. No joint effusion. No erosive changes. Patchy subchondral cystic change at the lunotriquetral articulation. Subcortical cystic change in the second  and third metacarpal heads. Mild degenerative changes of the first MCP joint. Ligaments Collateral ligaments are intact. Extrinsic ligaments are intact. Scapholunate and lunotriquetral ligaments are intact. Soft tissue and Muscles Irregular lobulated heterogenous T2 hyperintense/T1 Iso to hypointense peripherally enhancing fluid signal within the volar soft tissues of the ulnar hand, extending along the inferior margin of the flexor digiti minimi brevis muscle proximally, from its origin near the hook of the hamate, with intramuscular extension into the flexor digiti minimus brevis muscle distally and possible extension into the abductor digiti minimi muscle (series 5, image 21 and 22). This irregular region of inflammatory change, most concerning for soft tissue/intramuscular abscess, continues distally extending along the volar subcutaneous  soft tissues of the proximal fifth digit to the level of the mid fifth proximal phalanx, near the insertion of the flexor digiti minimi brevis muscle. This collection measures up to 5.3 x 3.1 x 1.3 cm (series 5, images 18-29; and series 8, images 5-10) with surrounding soft tissue edema and enhancement. Soft tissue edema and enhancement extends to the level of Guyon's canal proximally with the deep margin of the irregular collection described above abutting the traversing ulnar artery and nerve (series 10, images 15-17). Thickening and intermediate signal of the fifth digit flexor tendon as it traverses subjacent to the overlying region of inflammatory change described below. Trace mild tenosynovitis of the second through fifth digit flexor tendons. Flexor and extensor compartment tendons are otherwise intact. IMPRESSION: 1. Findings concerning for 5.3 x 3.1 x 1.3 cm irregular peripherally enhancing soft tissue/intramuscular abscess within the volar soft tissues of the left ulnar hand, extending along the inferior margin of the flexor digiti minimi brevis muscle proximally,  from near its origin at the hook of the hamate, with intramuscular extension into the flexor digiti minimus brevis muscle distally and possible extension into the abductor digiti minimi muscle. This complex collection extends along the volar soft tissues of the hand to the level of the fifth proximal phalanx, near the insertion of the flexor digiti minimi brevis muscle. 2. Associated soft tissue/intramuscular edema and enhancement involving the surrounding flexor digiti minimi brevis, abductor digiti minimi, and opponens digiti minimi muscles as well as the adjacent dorsal and volar interosseous muscles, concerning for myositis/fasciitis with surrounding cellulitis. Soft tissue inflammatory changes extend to the level of Guyon's canal proximally with the deep margin of the irregular collection described above abutting the traversing ulnar artery and nerve. 3. Mild tenosynovitis of second through fifth digit flexor tendons. 4. No acute osseous abnormality. Electronically Signed   By: Mannie Seek M.D.   On: 10/16/2023 09:27    Disposition Plan & Communication  Patient status: Inpatient  Admitted From: Home Planned disposition location: Home Anticipated discharge date: 5/1 pending ortho workup   Family Communication: none at bedside    Author: Luna Salinas, MD [facility] 10/17/2023, 4:05 PM   Available by Epic secure chat 7AM-7PM. If 7PM-7AM, please contact night-coverage.  [facility] contact information found on [email or web address].

## 2023-10-17 NOTE — Inpatient Diabetes Management (Signed)
 Inpatient Diabetes Program Recommendations  AACE/ADA: New Consensus Statement on Inpatient Glycemic Control (2015)  Target Ranges:  Prepandial:   less than 140 mg/dL      Peak postprandial:   less than 180 mg/dL (1-2 hours)      Critically ill patients:  140 - 180 mg/dL   Lab Results  Component Value Date   GLUCAP 222 (H) 10/17/2023   HGBA1C 13.1 (H) 10/16/2023    Review of Glycemic Control  Latest Reference Range & Units 10/16/23 20:27 10/16/23 23:59 10/17/23 02:19 10/17/23 08:03  Glucose-Capillary 70 - 99 mg/dL 696 (H) 295 (H) 284 (H) 222 (H)  (H): Data is abnormally high Diabetes history: Type 2 DM Outpatient Diabetes medications: Semglee  32 units at bedtime, Humalog  15 units TID, Metformin  1000 mg BID Current orders for Inpatient glycemic control: Novolog  0-15 units TID & HS, Semglee  32 units QD  Decadron 5 mg x 1  Inpatient Diabetes Program Recommendations:    Consider adding Novolog  6 units TID (assuming patient is consuming >50% of meals)  Thanks, Marjo Sievert, MSN, RNC-OB Diabetes Coordinator [phone] (8a-5p)

## 2023-10-18 DIAGNOSIS — I10 Essential (primary) hypertension: Secondary | ICD-10-CM | POA: Diagnosis not present

## 2023-10-18 DIAGNOSIS — L03114 Cellulitis of left upper limb: Secondary | ICD-10-CM | POA: Diagnosis not present

## 2023-10-18 DIAGNOSIS — F32A Depression, unspecified: Secondary | ICD-10-CM | POA: Diagnosis not present

## 2023-10-18 DIAGNOSIS — L02512 Cutaneous abscess of left hand: Secondary | ICD-10-CM | POA: Diagnosis not present

## 2023-10-18 LAB — GLUCOSE, CAPILLARY: Glucose-Capillary: 95 mg/dL (ref 70–99)

## 2023-10-18 MED ORDER — DOCUSATE SODIUM 100 MG PO CAPS: 100.0000 mg | ORAL_CAPSULE | Freq: Two times a day (BID) | ORAL | 0 refills | Status: AC

## 2023-10-18 MED ORDER — CEFADROXIL 500 MG PO CAPS: 1000.0000 mg | ORAL_CAPSULE | Freq: Two times a day (BID) | ORAL | 0 refills | Status: AC

## 2023-10-18 MED ORDER — MAGNESIUM HYDROXIDE 400 MG/5ML PO SUSP: 30.0000 mL | Freq: Every day | ORAL | 0 refills | Status: AC | PRN

## 2023-10-18 MED ORDER — CEFADROXIL 500 MG PO CAPS
1000.0000 mg | ORAL_CAPSULE | Freq: Two times a day (BID) | ORAL | Status: DC
  Administered 2023-10-18: 1000 mg via ORAL
  Filled 2023-10-18: qty 2

## 2023-10-18 MED ORDER — HYDROCODONE-ACETAMINOPHEN 5-325 MG PO TABS
1.0000 | ORAL_TABLET | ORAL | 0 refills | Status: AC | PRN
Start: 2023-10-18 — End: ?

## 2023-10-18 NOTE — Plan of Care (Signed)

## 2023-10-18 NOTE — Progress Notes (Signed)
   Subjective: 2 Days Post-Op Procedure(s) (LRB): INCISION AND DRAINAGE OF DEEP ABSCESS, HAND (Left) Patient reports pain as mild.  90% improved pain and swelling Patient is well, and has had no acute complaints or problems   Objective: Vital signs in last 24 hours: Temp:  [97.4 F (36.3 C)-98.2 F (36.8 C)] 98 F (36.7 C) (05/01 0503) Pulse Rate:  [74-97] 74 (05/01 0503) Resp:  [17-19] 19 (05/01 0503) BP: (103-122)/(69-89) 103/74 (05/01 0503) SpO2:  [99 %-100 %] 99 % (05/01 0503) Weight:  [75.5 kg] (P) 75.5 kg (05/01 0503)  Intake/Output from previous day: 04/30 0701 - 05/01 0700 In: 890 [P.O.:440; IV Piggyback:450] Out: -  Intake/Output this shift: No intake/output data recorded.  Recent Labs    10/15/23 1227 10/16/23 0343 10/17/23 0424  HGB 12.2* 10.8* 9.7*   Recent Labs    10/16/23 0343 10/17/23 0424  WBC 12.5* 12.3*  RBC 4.83 4.38  HCT 31.0* 29.0*  PLT 298 302   Recent Labs    10/16/23 0343 10/17/23 0424  NA 127* 129*  K 3.7 4.1  CL 97* 103  CO2 22 23  BUN 11 10  CREATININE 0.64 0.61  GLUCOSE 245* 289*  CALCIUM  8.8* 8.9   No results for input(s): "LABPT", "INR" in the last 72 hours.  EXAM General - Patient is Alert, Appropriate, and Oriented Extremity - Neurovascular intact Sensation intact distally Intact pulses distally Close to full composite fist. Drain removed, new dressing applied. No drainage Dressing - dressing C/D/I and no drainage   Past Medical History:  Diagnosis Date   Diabetes mellitus without complication (HCC)    Hypertension     Assessment/Plan:   2 Days Post-Op Procedure(s) (LRB): INCISION AND DRAINAGE OF DEEP ABSCESS, HAND (Left) Principal Problem:   Cellulitis of left hand Active Problems:   Essential hypertension   Hyperlipidemia   Eczema   Depression   Insulin  dependent type 2 diabetes mellitus (HCC)   Hyperosmolar hyponatremia   Cellulitis   Abscess of left hand  Estimated body mass index is 23.21  kg/m (pended) as calculated from the following:   Height as of this encounter: 5\' 11"  (1.803 m).   Weight as of this encounter: (P) 75.5 kg. Pain and swelling much improved after I&D. Continue with antibiotics. Follow up with [facility] Tuesday of next week for recheck Keep dressing clean and dry until follow up appt  T. Thomos Flies, PA-C [facility] 10/18/2023, 7:47 AM

## 2023-10-18 NOTE — Plan of Care (Signed)
  Problem: Education: Goal: Ability to describe self-care measures that may prevent or decrease complications (Diabetes Survival Skills Education) will improve Outcome: Adequate for Discharge Goal: Individualized Educational Video(s) Outcome: Adequate for Discharge   Problem: Coping: Goal: Ability to adjust to condition or change in health will improve Outcome: Adequate for Discharge   Problem: Fluid Volume: Goal: Ability to maintain a balanced intake and output will improve Outcome: Adequate for Discharge   Problem: Health Behavior/Discharge Planning: Goal: Ability to identify and utilize available resources and services will improve Outcome: Adequate for Discharge Goal: Ability to manage health-related needs will improve Outcome: Adequate for Discharge   Problem: Metabolic: Goal: Ability to maintain appropriate glucose levels will improve Outcome: Adequate for Discharge   Problem: Nutritional: Goal: Maintenance of adequate nutrition will improve Outcome: Adequate for Discharge Goal: Progress toward achieving an optimal weight will improve Outcome: Adequate for Discharge   Problem: Skin Integrity: Goal: Risk for impaired skin integrity will decrease Outcome: Adequate for Discharge   Problem: Tissue Perfusion: Goal: Adequacy of tissue perfusion will improve Outcome: Adequate for Discharge   Problem: Education: Goal: Knowledge of General Education information will improve Description: Including pain rating scale, medication(s)/side effects and non-pharmacologic comfort measures Outcome: Adequate for Discharge   Problem: Health Behavior/Discharge Planning: Goal: Ability to manage health-related needs will improve Outcome: Adequate for Discharge   Problem: Clinical Measurements: Goal: Ability to maintain clinical measurements within normal limits will improve Outcome: Adequate for Discharge Goal: Will remain free from infection Outcome: Adequate for Discharge Goal:  Diagnostic test results will improve Outcome: Adequate for Discharge Goal: Respiratory complications will improve Outcome: Adequate for Discharge Goal: Cardiovascular complication will be avoided Outcome: Adequate for Discharge   Problem: Activity: Goal: Risk for activity intolerance will decrease Outcome: Adequate for Discharge   Problem: Nutrition: Goal: Adequate nutrition will be maintained Outcome: Adequate for Discharge   Problem: Coping: Goal: Level of anxiety will decrease Outcome: Adequate for Discharge   Problem: Elimination: Goal: Will not experience complications related to bowel motility Outcome: Adequate for Discharge Goal: Will not experience complications related to urinary retention Outcome: Adequate for Discharge   Problem: Pain Managment: Goal: General experience of comfort will improve and/or be controlled Outcome: Adequate for Discharge   Problem: Safety: Goal: Ability to remain free from injury will improve Outcome: Adequate for Discharge   Problem: Skin Integrity: Goal: Risk for impaired skin integrity will decrease Outcome: Adequate for Discharge   Problem: Clinical Measurements: Goal: Ability to avoid or minimize complications of infection will improve Outcome: Adequate for Discharge   Problem: Skin Integrity: Goal: Skin integrity will improve Outcome: Adequate for Discharge

## 2023-10-18 NOTE — Inpatient Diabetes Management (Signed)
 Inpatient Diabetes Program Recommendations  AACE/ADA: New Consensus Statement on Inpatient Glycemic Control  Target Ranges:  Prepandial:   less than 140 mg/dL      Peak postprandial:   less than 180 mg/dL (1-2 hours)      Critically ill patients:  140 - 180 mg/dL    Latest Reference Range & Units 10/16/23 16:59 10/16/23 20:27 10/16/23 23:59 10/17/23 02:19 10/17/23 08:03 10/17/23 12:09 10/17/23 17:05 10/17/23 20:18 10/18/23 08:06  Glucose-Capillary 70 - 99 mg/dL 91 841 (H) 324 (H) 401 (H) 222 (H) 290 (H) 266 (H) 213 (H) 95    Latest Reference Range & Units 10/24/22 19:11 10/16/23 03:43  Hemoglobin A1C 4.8 - 5.6 % 11.8 (H) 13.1 (H)   Review of Glycemic Control  Diabetes history: DM2 Outpatient Diabetes medications: Semglee  32 units at bedtime, Humalog  10 units TID with meals plus 0-11 units for correction, Metformin  1000 mg BID; Dexcom G7 CGM Current orders for Inpatient glycemic control: Semglee  20 units BID, Novolog  0-15 units AC&HS  Inpatient Diabetes Program Recommendations:    Insulin : Noted Semglee  changed from 32 units daily to 20 units BID on 10/17/23 and CBG 95 mg/dl today.  If post prandial glucose remains consistently over 180 mg/dl, please consider ordering Novolog  5 units TID with meals for meal coverage if patient eats at least 50% of meals.  HbgA1C: A1C 13.1% on 10/16/23 indicating an average glucose of 329 mg/dl over the past 2-3 months.  NOTE: Patient admitted 10/15/23 with left hand cellulitis. A1C 13.1% on 10/16/23 indicating very poor DM control. Patient was in ED on 10/09/23 with dizziness and dysuria with glucose of 458 mg/dl on arrival. Inpatient diabetes coordinator spoke with patient on 10/09/23. Per conversation with patient on 10/09/23, patient reported lows at night so he was adjusting Semglee  based on glucose at night. Patient goes to [facility] for primary care and uses Dexcom G7 CGM for glucose monitoring.   Thanks, Beacher Limerick, RN, MSN, [facility] Diabetes  Coordinator Inpatient Diabetes Program [phone] (Team Pager from 8am to 5pm)

## 2023-10-18 NOTE — Discharge Summary (Signed)
 Physician Discharge Summary   Patient: Gregory Gibson MRN: [id number] DOB: 29-Jul-1975  Admit date:     10/15/2023  Discharge date: 10/18/23  Discharge Physician: Luna Salinas   PCP: [facility], [facility]   Recommendations at discharge:  Please obtain CBC and BMP on follow-up Please follow-up on final culture results-he is being discharged on cefpodoxime if found to have MRSA then might need to add doxycycline  or Bactrim  Follow-up with orthopedic surgery on Tuesday Follow-up with primary care provider  Discharge Diagnoses: Principal Problem:   Cellulitis of left hand Active Problems:   Essential hypertension   Hyperlipidemia   Eczema   Depression   Insulin  dependent type 2 diabetes mellitus (HCC)   Hyperosmolar hyponatremia   Cellulitis   Abscess of left hand   Hospital Course: [name] is a 48 year old male with history of hypertension, hyperlipidemia, insulin -dependent diabetes mellitus, depression, who presents emergency department for chief concerns of left hand swelling.  Vitals in the ED showed temperature of 98.3, respiration rate 20, heart rate 108, blood pressure 138/72, SpO2 100% on room air.  Serum sodium is 130, potassium 4.1, chloride 95, bicarb 24, BUN of 19, serum creatinine of 2.84, EGFR greater than 60, nonfasting blood glucose 378, WBC 10.3, hemoglobin 12.2, platelets of 341.  AST, ALT, were within normal limits.  Lactic acid was elevated at 2.3.  ED treatment: Ceftriaxone  2 g IV one-time dose, vancomycin , LR 1 L bolus.  Patient was admitted for left hand cellulitis, MRI with concern of abscess s/p incision and drainage by orthopedic surgery.  Blood cultures remain negative.  Wound cultures with gram-positive cocci in pairs.  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  Patient received ceftriaxone  and vancomycin  while in the hospital and is being discharged on cefadroxil  for 1 week.  Patient should keep his dressing clean and will  follow-up with orthopedic surgery on Tuesday for further recommendations.  1 dressing change was done before discharge.  Patient has uncontrolled diabetes with A1c of 13.1 and hyperglycemia.  That can increase the risk of infections.  Patient need better control of diabetes and a close follow-up with PCP.  Patient was given opioids to use for pain if Tylenol  or ibuprofen  does not work.  He was instructed to avoid constipation and it can cause dizziness.  Patient will continue the rest of his home medications and need to have a close follow-up with his providers for further recommendation   Pain control - [place]  Controlled Substance Reporting System database was reviewed. and patient was instructed, not to drive, operate heavy machinery, perform activities at heights, swimming or participation in water activities or provide baby-sitting services while on Pain, Sleep and Anxiety Medications; until their outpatient Physician has advised to do so again. Also recommended to not to take more than prescribed Pain, Sleep and Anxiety Medications.  Consultants: Orthopedic surgery Procedures performed: Incision and drainage of left hand abscess Disposition: Home Diet recommendation:  Carb modified diet DISCHARGE MEDICATION: Allergies as of 10/18/2023       Reactions   Gadavist  [gadobutrol ] Nausea And Vomiting   Patient vomits immediately  after injection        Medication List     STOP taking these medications    amoxicillin -clavulanate 875-125 MG tablet Commonly known as: AUGMENTIN    atorvastatin  20 MG tablet Commonly known as: LIPITOR   oxyCODONE -acetaminophen  5-325 MG tablet Commonly known as: Percocet   sertraline  50 MG tablet Commonly known as: ZOLOFT    sulfamethoxazole -trimethoprim  800-160  MG tablet Commonly known as: BACTRIM  DS   triamcinolone cream 0.1 % Commonly known as: KENALOG       TAKE these medications    BD Pen Needle Nano U/F 32G X 4 MM Misc Generic  drug: Insulin  Pen Needle 1 Dose by Does not apply route 3 (three) times daily. May dispense any manufacturer covered by patient's insurance.   Blood Glucose Monitoring Suppl Devi 1 each by Does not apply route 3 (three) times daily. May dispense any manufacturer covered by patient's insurance.   BLOOD GLUCOSE TEST STRIPS Strp 1 each by Does not apply route 3 (three) times daily. Use as directed to check blood sugar. May dispense any manufacturer covered by patient's insurance and fits patient's device.   cefadroxil  500 MG capsule Commonly known as: DURICEF Take 2 capsules (1,000 mg total) by mouth 2 (two) times daily for 7 days.   chlorhexidine  0.12 % solution Commonly known as: PERIDEX  Use as directed 15 mLs in the mouth or throat 2 (two) times daily.   clobetasol cream 0.05 % Commonly known as: TEMOVATE Apply 1 Application topically 2 (two) times a week.   Dexcom G7 Sensor Misc 1 Units by Does not apply route with breakfast, with lunch, and with evening meal.   docusate sodium  100 MG capsule Commonly known as: COLACE Take 1 capsule (100 mg total) by mouth 2 (two) times daily.   fenofibrate  145 MG tablet Commonly known as: TRICOR  Take 145 mg by mouth daily.   HYDROcodone -acetaminophen  5-325 MG tablet Commonly known as: NORCO/VICODIN Take 1-2 tablets by mouth every 4 (four) hours as needed for moderate pain (pain score 4-6).   hydrOXYzine 25 MG tablet Commonly known as: ATARAX Take 25 mg by mouth at bedtime.   ibuprofen  800 MG tablet Commonly known as: ADVIL  Take 1 tablet (800 mg total) by mouth every 8 (eight) hours as needed.   insulin  glargine-yfgn 100 UNIT/ML Pen Commonly known as: SEMGLEE  Inject 32 Units into the skin at bedtime. May substitute as needed per insurance.   insulin  lispro 100 UNIT/ML KwikPen Commonly known as: HUMALOG  Inject 0-21 Units into the skin 3 (three) times daily with meals. If eating and Blood Glucose (BG) 80 or higher inject 10 units for  meal coverage and add correction dose per scale. If not eating, correction dose only. BG <150= 0 unit; BG 150-200= 1 unit; BG 201-250= 3 unit; BG 251-300= 5 unit; BG 301-350= 7 unit; BG 351-400= 9 unit; BG >400= 11 unit and Call Primary Care.   Lancet Device Misc 1 each by Does not apply route 3 (three) times daily. May dispense any manufacturer covered by patient's insurance.   Lancets Misc 1 each by Does not apply route 3 (three) times daily. Use as directed to check blood sugar. May dispense any manufacturer covered by patient's insurance and fits patient's device.   lisinopril  10 MG tablet Commonly known as: ZESTRIL  Take 1 tablet (10 mg total) by mouth daily.   lovastatin 10 MG tablet Commonly known as: MEVACOR Take 10 mg by mouth at bedtime.   magnesium  hydroxide 400 MG/5ML suspension Commonly known as: MILK OF MAGNESIA Take 30 mLs by mouth daily as needed for mild constipation.   metFORMIN  1000 MG tablet Commonly known as: GLUCOPHAGE  Take 1,000 mg by mouth 2 (two) times daily with a meal.   ondansetron  4 MG disintegrating tablet Commonly known as: ZOFRAN -ODT Take 1 tablet (4 mg total) by mouth every 6 (six) hours as needed for nausea or vomiting.  Follow-up Information     Coralyn Derry, PA-C Follow up on 10/23/2023.   Specialties: Orthopedic Surgery, Emergency Medicine Contact information: [address] [place] [address] [phone]         [facility]. Schedule an appointment as soon as possible for a visit in 1 week(s).   Specialty: General Practice Contact information: [address] [place] [address] [phone]                Discharge Exam: Filed Weights   10/15/23 1721 10/15/23 1942 10/18/23 0503  Weight: 77.6 kg 69.6 kg (P) 75.5 kg   General.  Well-developed gentleman, in no acute distress. Pulmonary.  Lungs clear bilaterally, normal respiratory effort. CV.  Regular rate and rhythm, no JVD,  rub or murmur. Abdomen.  Soft, nontender, nondistended, BS positive. CNS.  Alert and oriented .  No focal neurologic deficit. Extremities.  No edema, no cyanosis, pulses intact and symmetrical.  Left hand with Ace wrap Psychiatry.  Judgment and insight appears normal.   Condition at discharge: stable  The results of significant diagnostics from this hospitalization (including imaging, microbiology, ancillary and laboratory) are listed below for reference.   Imaging Studies: MR HAND LEFT W WO CONTRAST Result Date: 10/16/2023 CLINICAL DATA:  Left hand swelling and pain underneath the fifth digit. EXAM: MRI OF THE LEFT HAND WITHOUT AND WITH CONTRAST TECHNIQUE: Multiplanar, multisequence MR imaging of the left hand was performed before and after the administration of intravenous contrast. CONTRAST:  6mL GADAVIST  GADOBUTROL  1 MMOL/ML IV SOLN COMPARISON:  None Available. FINDINGS: Bones/Joint/Cartilage No marrow signal abnormality. No fracture or dislocation. Normal alignment. No joint effusion. No erosive changes. Patchy subchondral cystic change at the lunotriquetral articulation. Subcortical cystic change in the second and third metacarpal heads. Mild degenerative changes of the first MCP joint. Ligaments Collateral ligaments are intact. Extrinsic ligaments are intact. Scapholunate and lunotriquetral ligaments are intact. Soft tissue and Muscles Irregular lobulated heterogenous T2 hyperintense/T1 Iso to hypointense peripherally enhancing fluid signal within the volar soft tissues of the ulnar hand, extending along the inferior margin of the flexor digiti minimi brevis muscle proximally, from its origin near the hook of the hamate, with intramuscular extension into the flexor digiti minimus brevis muscle distally and possible extension into the abductor digiti minimi muscle (series 5, image 21 and 22). This irregular region of inflammatory change, most concerning for soft tissue/intramuscular abscess,  continues distally extending along the volar subcutaneous soft tissues of the proximal fifth digit to the level of the mid fifth proximal phalanx, near the insertion of the flexor digiti minimi brevis muscle. This collection measures up to 5.3 x 3.1 x 1.3 cm (series 5, images 18-29; and series 8, images 5-10) with surrounding soft tissue edema and enhancement. Soft tissue edema and enhancement extends to the level of Guyon's canal proximally with the deep margin of the irregular collection described above abutting the traversing ulnar artery and nerve (series 10, images 15-17). Thickening and intermediate signal of the fifth digit flexor tendon as it traverses subjacent to the overlying region of inflammatory change described below. Trace mild tenosynovitis of the second through fifth digit flexor tendons. Flexor and extensor compartment tendons are otherwise intact. IMPRESSION: 1. Findings concerning for 5.3 x 3.1 x 1.3 cm irregular peripherally enhancing soft tissue/intramuscular abscess within the volar soft tissues of the left ulnar hand, extending along the inferior margin of the flexor digiti minimi brevis muscle proximally, from near its origin at the hook of  the hamate, with intramuscular extension into the flexor digiti minimus brevis muscle distally and possible extension into the abductor digiti minimi muscle. This complex collection extends along the volar soft tissues of the hand to the level of the fifth proximal phalanx, near the insertion of the flexor digiti minimi brevis muscle. 2. Associated soft tissue/intramuscular edema and enhancement involving the surrounding flexor digiti minimi brevis, abductor digiti minimi, and opponens digiti minimi muscles as well as the adjacent dorsal and volar interosseous muscles, concerning for myositis/fasciitis with surrounding cellulitis. Soft tissue inflammatory changes extend to the level of Guyon's canal proximally with the deep margin of the irregular  collection described above abutting the traversing ulnar artery and nerve. 3. Mild tenosynovitis of second through fifth digit flexor tendons. 4. No acute osseous abnormality. Electronically Signed   By: Mannie Seek M.D.   On: 10/16/2023 09:27    Microbiology: Results for orders placed or performed during the hospital encounter of 10/15/23  Blood Culture (routine x 2)     Status: None (Preliminary result)   Collection Time: 10/15/23  5:36 PM   Specimen: BLOOD  Result Value Ref Range Status   Specimen Description BLOOD RIGHT ANTECUBITAL  Final   Special Requests   Final    BOTTLES DRAWN AEROBIC AND ANAEROBIC Blood Culture results may not be optimal due to an inadequate volume of blood received in culture bottles   Culture   Final    NO GROWTH 3 DAYS Performed at [facility], [address] [place], [place] [address]    Report Status PENDING  Incomplete  Blood Culture (routine x 2)     Status: None (Preliminary result)   Collection Time: 10/15/23  5:36 PM   Specimen: BLOOD  Result Value Ref Range Status   Specimen Description BLOOD RT UPPER ARM  Final   Special Requests   Final    BOTTLES DRAWN AEROBIC AND ANAEROBIC Blood Culture results may not be optimal due to an inadequate volume of blood received in culture bottles   Culture   Final    NO GROWTH 3 DAYS Performed at [facility], [address] [place], [place] [address]    Report Status PENDING  Incomplete  Resp panel by RT-PCR (RSV, Flu A&B, Covid) Anterior Nasal Swab     Status: None   Collection Time: 10/15/23  6:19 PM   Specimen: Anterior Nasal Swab  Result Value Ref Range Status   SARS Coronavirus 2 by RT PCR NEGATIVE NEGATIVE Final    Comment: (NOTE) SARS-CoV-2 target nucleic acids are NOT DETECTED.  The SARS-CoV-2 RNA is generally detectable in upper respiratory specimens during the acute phase of infection. The lowest concentration of SARS-CoV-2 viral copies this assay can detect is 138  copies/mL. A negative result does not preclude SARS-Cov-2 infection and should not be used as the sole basis for treatment or other patient management decisions. A negative result may occur with  improper specimen collection/handling, submission of specimen other than nasopharyngeal swab, presence of viral mutation(s) within the areas targeted by this assay, and inadequate number of viral copies(<138 copies/mL). A negative result must be combined with clinical observations, patient history, and epidemiological information. The expected result is Negative.  Fact Sheet for Patients:  BloggerCourse.com  Fact Sheet for Healthcare Providers:  SeriousBroker.it  This test is no t yet approved or cleared by the [place]  FDA and  has been authorized for detection and/or diagnosis of SARS-CoV-2 by FDA under an Emergency Use Authorization (EUA).  This EUA will remain  in effect (meaning this test can be used) for the duration of the COVID-19 declaration under Section 564(b)(1) of the Act, 21 U.S.C.section 360bbb-3(b)(1), unless the authorization is terminated  or revoked sooner.       Influenza A by PCR NEGATIVE NEGATIVE Final   Influenza B by PCR NEGATIVE NEGATIVE Final    Comment: (NOTE) The Xpert Xpress SARS-CoV-2/FLU/RSV plus assay is intended as an aid in the diagnosis of influenza from Nasopharyngeal swab specimens and should not be used as a sole basis for treatment. Nasal washings and aspirates are unacceptable for Xpert Xpress SARS-CoV-2/FLU/RSV testing.  Fact Sheet for Patients: BloggerCourse.com  Fact Sheet for Healthcare Providers: SeriousBroker.it  This test is not yet approved or cleared by the [place]  FDA and has been authorized for detection and/or diagnosis of SARS-CoV-2 by FDA under an Emergency Use Authorization (EUA). This EUA will remain in effect (meaning  this test can be used) for the duration of the COVID-19 declaration under Section 564(b)(1) of the Act, 21 U.S.C. section 360bbb-3(b)(1), unless the authorization is terminated or revoked.     Resp Syncytial Virus by PCR NEGATIVE NEGATIVE Final    Comment: (NOTE) Fact Sheet for Patients: BloggerCourse.com  Fact Sheet for Healthcare Providers: SeriousBroker.it  This test is not yet approved or cleared by the [place]  FDA and has been authorized for detection and/or diagnosis of SARS-CoV-2 by FDA under an Emergency Use Authorization (EUA). This EUA will remain in effect (meaning this test can be used) for the duration of the COVID-19 declaration under Section 564(b)(1) of the Act, 21 U.S.C. section 360bbb-3(b)(1), unless the authorization is terminated or revoked.  Performed at [facility], [address] [place], [place] [address]   Aerobic/Anaerobic Culture w Gram Stain (surgical/deep wound)     Status: None (Preliminary result)   Collection Time: 10/16/23  4:30 PM   Specimen: Abscess  Result Value Ref Range Status   Specimen Description   Final    ABSCESS Performed at [facility], [address] [place], [place] [address]    Special Requests   Final    NONE Performed at [facility], [address] [place], [place] [address]    Gram Stain   Final    ABUNDANT WBC PRESENT, PREDOMINANTLY PMN RARE GRAM POSITIVE COCCI IN PAIRS INTRACELLULAR    Culture   Final    TOO YOUNG TO READ Performed at [facility], [address], [place], [place] [address]    Report Status PENDING  Incomplete  Aerobic/Anaerobic Culture w Gram Stain (surgical/deep wound)     Status: None (Preliminary result)   Collection Time: 10/16/23  4:31 PM   Specimen: Abscess  Result Value Ref Range Status   Specimen Description   Final    ABSCESS Performed at [facility], [address],  [place], [place] [address]    Special Requests   Final    NONE Performed at [facility], [address] [place], [place] [address]    Gram Stain   Final    ABUNDANT WBC PRESENT, PREDOMINANTLY PMN RARE GRAM POSITIVE COCCI IN PAIRS    Culture   Final    CULTURE REINCUBATED FOR BETTER GROWTH Performed at [facility], [address] [place], [place] [address]    Report Status PENDING  Incomplete    Labs: CBC: Recent Labs  Lab 10/15/23 1227 10/16/23 0343 10/17/23 0424  WBC 10.3 12.5* 12.3*  NEUTROABS 8.6*  --   --   HGB 12.2* 10.8* 9.7*  HCT 36.5* 31.0* 29.0*  MCV 66.1* 64.2* 66.2*  PLT 341 298 302   Basic Metabolic Panel: Recent Labs  Lab 10/15/23 1227 10/16/23 0343 10/17/23 0424  NA 130* 127* 129*  K 4.1 3.7 4.1  CL 95* 97* 103  CO2 24 22 23   GLUCOSE 378* 245* 289*  BUN 19 11 10   CREATININE 0.84 0.64 0.61  CALCIUM  9.6 8.8* 8.9   Liver Function Tests: Recent Labs  Lab 10/15/23 1227 10/17/23 0424  AST 21 21  ALT 18 16  ALKPHOS 118 86  BILITOT 0.4 0.3  PROT 7.5 6.0*  ALBUMIN 3.6 2.8*   CBG: Recent Labs  Lab 10/17/23 0803 10/17/23 1209 10/17/23 1705 10/17/23 2018 10/18/23 0806  GLUCAP 222* 290* 266* 213* 95    Discharge time spent: greater than 30 minutes.  This record has been created using Conservation officer, historic buildings. Errors have been sought and corrected,but may not always be located. Such creation errors do not reflect on the standard of care.   Signed: Luna Salinas, MD [facility] 10/18/2023

## 2023-10-20 LAB — CULTURE, BLOOD (ROUTINE X 2)
Culture: NO GROWTH
Culture: NO GROWTH

## 2023-10-21 LAB — AEROBIC/ANAEROBIC CULTURE W GRAM STAIN (SURGICAL/DEEP WOUND)
